# Patient Record
Sex: Male | Born: 1975 | Race: White | Hispanic: No | Marital: Single | Smoking: Never smoker
Health system: Southern US, Community
[De-identification: ages and names within clinical notes are randomized; demographics above are authoritative.]

## PROBLEM LIST (undated history)

## (undated) ENCOUNTER — Ambulatory Visit: Discharge status: Absent without leave | Payer: Self-pay

## (undated) DIAGNOSIS — J45909 Unspecified asthma, uncomplicated: Secondary | ICD-10-CM

## (undated) DIAGNOSIS — E119 Type 2 diabetes mellitus without complications: Secondary | ICD-10-CM

---

## 1998-11-12 ENCOUNTER — Emergency Department (HOSPITAL_COMMUNITY): Admission: EM | Admit: 1998-11-12 | Discharge: 1998-11-12 | Payer: Self-pay | Admitting: Emergency Medicine

## 2006-11-12 ENCOUNTER — Emergency Department: Payer: Self-pay | Admitting: Unknown Physician Specialty

## 2008-11-18 ENCOUNTER — Inpatient Hospital Stay: Payer: Self-pay | Admitting: Internal Medicine

## 2009-06-26 ENCOUNTER — Emergency Department: Payer: Self-pay | Admitting: Emergency Medicine

## 2009-06-28 ENCOUNTER — Emergency Department: Payer: Self-pay | Admitting: Emergency Medicine

## 2011-05-16 ENCOUNTER — Inpatient Hospital Stay: Payer: Self-pay | Admitting: Internal Medicine

## 2011-06-05 ENCOUNTER — Ambulatory Visit: Payer: Self-pay | Admitting: Internal Medicine

## 2011-06-30 ENCOUNTER — Ambulatory Visit: Payer: Self-pay | Admitting: Internal Medicine

## 2011-07-28 ENCOUNTER — Ambulatory Visit: Payer: Self-pay | Admitting: Internal Medicine

## 2014-09-20 NOTE — Discharge Summary (Signed)
PATIENT NAME:  Dylan Buck, [name] MR#:  [id number] DATE OF BIRTH:  [birth year]  DATE OF ADMISSION:  05/16/2011 DATE OF DISCHARGE:  05/18/2011  PRIMARY CARE PHYSICIAN: Dr. Loma Senderharles Phillips   REASON FOR ADMISSION: Abdominal pain, nausea, vomiting.   DISCHARGE DIAGNOSES:  1. Diabetic ketoacidosis.  2. Abdominal pain, nausea, vomiting secondary to diabetic ketoacidosis.  3. Poorly controlled diabetes mellitus, now insulin requiring.  4. History of asthma.  5. Leukocytosis, likely stress induced.  6. Sinus tachycardia.   DISCHARGE MEDICATIONS:  1. Lantus 28 units subcutaneous q.a.m. 2. Glulisine 7 units subcutaneously q.a.c. 3. Glulisine sliding scale insulin q.a.c. and at bedtime. See prescription for sliding scale. 4. Aspirin 81 mg daily.  5. Tylenol 325 mg 1 to 2 tablets p.o. every 4 to 6 hours p.r.n. pain.  6. Prilosec 20 mg p.o. b.i.d.   CONSULTATIONS: Endocrinology Dr. Tedd SiasSolum.   LABORATORY, DIAGNOSTIC AND RADIOLOGICAL DATA:  Portable chest x-ray 05/16/2011: No acute cardiopulmonary abnormalities are noted.   CT of the abdomen and pelvis without contrast 05/16/2011: Subtle focus of mineral deposition left kidney. No hydronephrosis. Stomach and esophagus are mildly distended with fluid, nonspecific. There is possible gallbladder sludge.    Serum glucose 535 on admission with anion gap 28, bicarbonate 96, hemoglobin A1c 14.5.   Cardiac enzymes negative on admission.   CBC normal on admission except for WBC elevated at 21.   WBC 6.7 on the day of discharge.   Blood cultures x2 from 05/16/2011: No growth to date.   Urine culture 05/16/2011: No growth to date.   Insulin level low at 1.1 and C-peptide level 0.1.   Anti-GAD antibodies were negative.   ABG on admission pH 7, pCO2 19, pO2 121, bicarbonate incalculable.   BRIEF HISTORY AND HOSPITAL COURSE: Patient is a 39 year old male with history of diabetes mellitus who presented to the Emergency Department with complaints of  abdominal pain, nausea, vomiting. Please see dictated admission history and physical for pertinent details surrounding the onset of this hospitalization. Please see below for further details.  1. Abdominal pain, nausea/vomiting secondary diabetic ketoacidosis, as evidenced by elevated serum glucose levels with anion gap metabolic acidosis. Patient underwent CT of the abdomen which revealed some distention of his esophagus and stomach, otherwise was unremarkable. Patient was admitted to Critical Care Unit and placed on an insulin drip in addition to IV fluids. With these measures patient's overall clinical condition has significantly improved and back to baseline and his abdominal pain, nausea, vomiting have resolved. Once his anion gap had closed he was taken off the insulin drip and switched over to subcutaneous insulin. He was on oral hypoglycemics at the time of admission. Endocrinology consultation was obtained and Dr. Tedd SiasSolum feels patient has insulin-dependent diabetes mellitus and was noted to have low insulin and C-peptide levels and anti-GAD antibodies were negative. For now patient will be on insulin as his diabetes appears to be very poorly controlled with hemoglobin A1c of 14.5. After being started on Lantus and glulisine scheduled as well as sliding scale his blood sugars are much better controlled.  2. Sinus tachycardia, due to acute illness, diabetic ketoacidosis, volume depletion and from vomiting and heart rate has normalized with IV fluids and management of patient's DKA.  3. Asthma. Stable and without acute exacerbation.  4. Leukocytosis felt to be stress induced. Blood and urine cultures did not reveal any growth to date. Patient was afebrile and it was felt less likely that he had infectious etiology of his diabetic ketoacidosis and his  diabetic ketoacidosis was felt to be secondary to insulin deficiency.  5. On 05/18/2011 patient was hemodynamically stable and without any abdominal pain,  nausea and vomiting and was felt to be stable for discharge home with close outpatient follow up. He will also follow up with Dr. Tedd Sias of endocrinology as an outpatient for further management of patient's diabetes mellitus.   FOLLOW UP INSTRUCTIONS:  1. Follow up with Dr. Loma Sender within 1 to 2 weeks.  2. Follow up with Dr. Tedd Sias within 1 to 2 weeks.    DISCHARGE DISPOSITION: Home.   DISCHARGE CONDITION: Improved, stable.  DISCHARGE ACTIVITY: As tolerated.   DISCHARGE DIET: ADA.   TIME SPENT ON DISCHARGE: Greater than 30 minutes.   ____________________________ Elon Alas, MD knl:cms D: 05/22/2011 22:00:23 ET T: 05/24/2011 11:39:45 ET JOB#: [id number]  cc: Elon Alas, MD, <Dictator> Marcine Matar., MD A. Wendall Mola, MD Elon Alas MD ELECTRONICALLY SIGNED 06/01/2011 16:24

## 2016-08-24 ENCOUNTER — Observation Stay
Admission: EM | Admit: 2016-08-24 | Discharge: 2016-08-25 | Disposition: A | Discharge status: Home / Self Care | Payer: Self-pay | Attending: Internal Medicine | Admitting: Internal Medicine

## 2016-08-24 ENCOUNTER — Encounter: Payer: Self-pay | Admitting: Emergency Medicine

## 2016-08-24 ENCOUNTER — Emergency Department: Payer: Self-pay

## 2016-08-24 DIAGNOSIS — J45909 Unspecified asthma, uncomplicated: Secondary | ICD-10-CM | POA: Insufficient documentation

## 2016-08-24 DIAGNOSIS — R0602 Shortness of breath: Secondary | ICD-10-CM | POA: Insufficient documentation

## 2016-08-24 DIAGNOSIS — E1010 Type 1 diabetes mellitus with ketoacidosis without coma: Secondary | ICD-10-CM | POA: Insufficient documentation

## 2016-08-24 DIAGNOSIS — D72829 Elevated white blood cell count, unspecified: Secondary | ICD-10-CM | POA: Insufficient documentation

## 2016-08-24 DIAGNOSIS — Z794 Long term (current) use of insulin: Secondary | ICD-10-CM | POA: Insufficient documentation

## 2016-08-24 DIAGNOSIS — R112 Nausea with vomiting, unspecified: Secondary | ICD-10-CM | POA: Insufficient documentation

## 2016-08-24 DIAGNOSIS — N179 Acute kidney failure, unspecified: Principal | ICD-10-CM | POA: Insufficient documentation

## 2016-08-24 DIAGNOSIS — W57XXXA Bitten or stung by nonvenomous insect and other nonvenomous arthropods, initial encounter: Secondary | ICD-10-CM | POA: Insufficient documentation

## 2016-08-24 DIAGNOSIS — E109 Type 1 diabetes mellitus without complications: Secondary | ICD-10-CM

## 2016-08-24 DIAGNOSIS — R51 Headache: Secondary | ICD-10-CM | POA: Insufficient documentation

## 2016-08-24 DIAGNOSIS — R109 Unspecified abdominal pain: Secondary | ICD-10-CM | POA: Insufficient documentation

## 2016-08-24 DIAGNOSIS — R42 Dizziness and giddiness: Secondary | ICD-10-CM | POA: Insufficient documentation

## 2016-08-24 DIAGNOSIS — E1069 Type 1 diabetes mellitus with other specified complication: Secondary | ICD-10-CM | POA: Insufficient documentation

## 2016-08-24 DIAGNOSIS — Z91040 Latex allergy status: Secondary | ICD-10-CM | POA: Insufficient documentation

## 2016-08-24 HISTORY — DX: Type 2 diabetes mellitus without complications: E11.9

## 2016-08-24 HISTORY — DX: Unspecified asthma, uncomplicated: J45.909

## 2016-08-24 LAB — COMPREHENSIVE METABOLIC PANEL
ALT: 30 U/L (ref 17–63)
AST: 25 U/L (ref 15–41)
Albumin: 5.1 g/dL — ABNORMAL HIGH (ref 3.5–5.0)
Alkaline Phosphatase: 87 U/L (ref 38–126)
Anion gap: 18 — ABNORMAL HIGH (ref 5–15)
BILIRUBIN TOTAL: 0.8 mg/dL (ref 0.3–1.2)
BUN: 32 mg/dL — AB (ref 6–20)
CALCIUM: 10.4 mg/dL — AB (ref 8.9–10.3)
CO2: 23 mmol/L (ref 22–32)
CREATININE: 1.89 mg/dL — AB (ref 0.61–1.24)
Chloride: 96 mmol/L — ABNORMAL LOW (ref 101–111)
GFR calc Af Amer: 49 mL/min — ABNORMAL LOW (ref >60)
GFR, EST NON AFRICAN AMERICAN: 42 mL/min — AB (ref >60)
Glucose, Bld: 172 mg/dL — ABNORMAL HIGH (ref 65–99)
POTASSIUM: 3.9 mmol/L (ref 3.5–5.1)
Sodium: 137 mmol/L (ref 135–145)
Total Protein: 8.4 g/dL — ABNORMAL HIGH (ref 6.5–8.1)

## 2016-08-24 LAB — TROPONIN I
Troponin I: <0.03 ng/mL (ref <0.03)
Troponin I: <0.03 ng/mL (ref <0.03)

## 2016-08-24 LAB — LACTIC ACID, PLASMA: LACTIC ACID, VENOUS: 1.4 mmol/L (ref 0.5–1.9)

## 2016-08-24 LAB — CBC
HCT: 43.4 % (ref 40.0–52.0)
Hemoglobin: 15.4 g/dL (ref 13.0–18.0)
MCH: 32.2 pg (ref 26.0–34.0)
MCHC: 35.4 g/dL (ref 32.0–36.0)
MCV: 90.8 fL (ref 80.0–100.0)
PLATELETS: 418 10*3/uL (ref 150–440)
RBC: 4.78 MIL/uL (ref 4.40–5.90)
RDW: 13.2 % (ref 11.5–14.5)
WBC: 18.4 10*3/uL — ABNORMAL HIGH (ref 3.8–10.6)

## 2016-08-24 LAB — GLUCOSE, CAPILLARY
GLUCOSE-CAPILLARY: 253 mg/dL — AB (ref 65–99)
Glucose-Capillary: 151 mg/dL — ABNORMAL HIGH (ref 65–99)

## 2016-08-24 LAB — SALICYLATE LEVEL: SALICYLATE LVL: 16.1 mg/dL (ref 2.8–30.0)

## 2016-08-24 LAB — CK: CK TOTAL: 272 U/L (ref 49–397)

## 2016-08-24 LAB — BETA-HYDROXYBUTYRIC ACID: BETA-HYDROXYBUTYRIC ACID: 2.22 mmol/L — AB (ref 0.05–0.27)

## 2016-08-24 MED ORDER — IOPAMIDOL (ISOVUE-300) INJECTION 61%
30.0000 mL | Freq: Once | INTRAVENOUS | Status: AC | PRN
  Administered 2016-08-24: 30 mL via ORAL

## 2016-08-24 MED ORDER — SODIUM CHLORIDE 0.9 % IV BOLUS (SEPSIS)
1000.0000 mL | Freq: Once | INTRAVENOUS | Status: AC
  Administered 2016-08-25: 1000 mL via INTRAVENOUS

## 2016-08-24 MED ORDER — ONDANSETRON HCL 4 MG/2ML IJ SOLN
4.0000 mg | Freq: Once | INTRAMUSCULAR | Status: AC
  Administered 2016-08-24: 4 mg via INTRAVENOUS
  Filled 2016-08-24: qty 2

## 2016-08-24 MED ORDER — IOPAMIDOL (ISOVUE-300) INJECTION 61%
75.0000 mL | Freq: Once | INTRAVENOUS | Status: AC | PRN
  Administered 2016-08-24: 75 mL via INTRAVENOUS

## 2016-08-24 MED ORDER — MORPHINE SULFATE (PF) 4 MG/ML IV SOLN
4.0000 mg | Freq: Once | INTRAVENOUS | Status: AC
  Administered 2016-08-24: 4 mg via INTRAVENOUS
  Filled 2016-08-24: qty 1

## 2016-08-24 MED ORDER — DOXYCYCLINE HYCLATE 100 MG PO CAPS: 100.0000 mg | ORAL_CAPSULE | Freq: Two times a day (BID) | ORAL | 0 refills | Status: DC

## 2016-08-24 MED ORDER — SODIUM CHLORIDE 0.9 % IV BOLUS (SEPSIS)
1000.0000 mL | Freq: Once | INTRAVENOUS | Status: AC
  Administered 2016-08-24: 1000 mL via INTRAVENOUS

## 2016-08-24 MED ORDER — INSULIN ASPART 100 UNIT/ML ~~LOC~~ SOLN
5.0000 [IU] | Freq: Once | SUBCUTANEOUS | Status: AC
  Administered 2016-08-25: 5 [IU] via SUBCUTANEOUS
  Filled 2016-08-24: qty 5

## 2016-08-24 NOTE — ED Provider Notes (Signed)
[facility] Emergency Department Provider Note   ____________________________________________   First MD Initiated Contact with Patient 08/24/16 2123     (approximate)  I have reviewed the triage vital signs and the nursing notes.   HISTORY  Chief Complaint Abdominal pain   HPI Dylan Buck is a 41 y.o. male reports she was in normal state of health, until about noon today when he been experiencing cramping abdominal discomfort, left-sided abdominal pain and then began feeling short of breath because of it hurting in his stomach whenever he went to breathe. No chest pain.  Patient reports he's had similar in the past a few years ago when he had "DKA", but he checked his blood sugar this morning and it was in the 150 range, covered normally with insulin, and has not missed any insulin and checks his blood sugar regularly and denies any recent blood sugars greater than 200.  He does report that he found a tick that was slightly engorged on his left ankle, that was found this morning but he denies any body aches, fevers chills or malaise. No headache.   Past Medical History:  Diagnosis Date  . Asthma   . Diabetes mellitus without complication [facility])     Patient Active Problem List   Diagnosis Date Noted  . Acute kidney injury (HCC) 08/25/2016  . AKI (acute kidney injury) (HCC) 08/25/2016    History reviewed. No pertinent surgical history.  Prior to Admission medications   Medication Sig Start Date End Date Taking? Authorizing Provider  insulin aspart (NOVOLOG) 100 UNIT/ML injection Inject 6 Units into the skin 3 (three) times daily before meals. Per sliding scale   Yes Historical Provider, MD  insulin glargine (LANTUS) 100 UNIT/ML injection Inject 28 Units into the skin at bedtime.   Yes Historical Provider, MD    Allergies Latex  History reviewed. No pertinent family history.  Social History Social History  Substance Use Topics  .  Smoking status: Never Smoker  . Smokeless tobacco: Never Used  . Alcohol use No  No smokeless tobacco Patient reports he used to use alcohol, but has quit for over 2 years now  Review of Systems Constitutional: No fever/chills Eyes: No visual changes. ENT: No sore throat. Cardiovascular: Denies chest pain. Respiratory: See history of present illness. No wheezing. Gastrointestinal: No diarrhea.  No constipation. Genitourinary: Negative for dysuria. Musculoskeletal: Negative for back pain. Skin: Negative for rash. Neurological: Negative for headaches, focal weakness or numbness.  10-point ROS otherwise negative.  ____________________________________________   PHYSICAL EXAM:  VITAL SIGNS: ED Triage Vitals  Enc Vitals Group     BP 08/24/16 1947 (!) 135/97     Pulse Rate 08/24/16 1947 (!) 112     Resp 08/24/16 1947 20     Temp 08/24/16 1947 97.9 F (36.6 C)     Temp Source 08/24/16 1947 Oral     SpO2 08/24/16 1947 100 %     Weight 08/24/16 1947 150 lb (68 kg)     Height 08/24/16 1947 5\' 11"  (1.803 m)     Head Circumference --      Peak Flow --      Pain Score 08/24/16 1946 0     Pain Loc --      Pain Edu? --      Excl. in GC? --     Constitutional: Alert and oriented. Well appearing and in no acute distress. Eyes: Conjunctivae are normal. PERRL. EOMI. Head: Atraumatic. Nose: No congestion/rhinnorhea.  Mouth/Throat: Mucous membranes are Slightly dry.  Oropharynx non-erythematous. Neck: No stridor.   Cardiovascular: Normal rate, regular rhythm. Grossly normal heart sounds.  Good peripheral circulation. Respiratory: Normal respiratory effort.  No retractions. Lungs CTAB. Gastrointestinal: Soft and reports mild tenderness to palpation in the left flank and left upper quadrant without rebound or guarding.. No distention. No abdominal bruits. No CVA tenderness. Musculoskeletal: No lower extremity tenderness nor edema.  No joint effusions. Neurologic:  Normal speech and  language. No gross focal neurologic deficits are appreciated. Skin:  Skin is warm, dry and intact. No rash noted. Psychiatric: Mood and affect are normal. Speech and behavior are normal.  ____________________________________________   LABS (all labs ordered are listed, but only abnormal results are displayed)  Labs Reviewed  CBC - Abnormal; Notable for the following:       Result Value   WBC 18.4 (*)    All other components within normal limits  COMPREHENSIVE METABOLIC PANEL - Abnormal; Notable for the following:    Chloride 96 (*)    Glucose, Bld 172 (*)    BUN 32 (*)    Creatinine, Ser 1.89 (*)    Calcium 10.4 (*)    Total Protein 8.4 (*)    Albumin 5.1 (*)    GFR calc non Af Amer 42 (*)    GFR calc Af Amer 49 (*)    Anion gap 18 (*)    All other components within normal limits  URINALYSIS, COMPLETE (UACMP) WITH MICROSCOPIC - Abnormal; Notable for the following:    Color, Urine YELLOW (*)    APPearance CLEAR (*)    Specific Gravity, Urine 1.031 (*)    Glucose, UA >=500 (*)    Ketones, ur 20 (*)    All other components within normal limits  BLOOD GAS, VENOUS - Abnormal; Notable for the following:    pCO2, Ven 40 (*)    All other components within normal limits  GLUCOSE, CAPILLARY - Abnormal; Notable for the following:    Glucose-Capillary 151 (*)    All other components within normal limits  BETA-HYDROXYBUTYRIC ACID - Abnormal; Notable for the following:    Beta-Hydroxybutyric Acid 2.22 (*)    All other components within normal limits  GLUCOSE, CAPILLARY - Abnormal; Notable for the following:    Glucose-Capillary 253 (*)    All other components within normal limits  BASIC METABOLIC PANEL - Abnormal; Notable for the following:    Sodium 133 (*)    Chloride 99 (*)    Glucose, Bld 277 (*)    BUN 31 (*)    Creatinine, Ser 1.46 (*)    GFR calc non Af Amer 58 (*)    All other components within normal limits  PHOSPHORUS - Abnormal; Notable for the following:     Phosphorus 5.1 (*)    All other components within normal limits  CBC - Abnormal; Notable for the following:    RBC 3.77 (*)    Hemoglobin 12.4 (*)    HCT 34.6 (*)    All other components within normal limits  COMPREHENSIVE METABOLIC PANEL - Abnormal; Notable for the following:    Sodium 133 (*)    Glucose, Bld 342 (*)    BUN 26 (*)    Calcium 8.1 (*)    Total Protein 6.0 (*)    All other components within normal limits  URINE DRUG SCREEN, QUALITATIVE ([facility] ONLY) - Abnormal; Notable for the following:    Opiate, Ur Screen POSITIVE (*)  Cannabinoid 50 Ng, Ur [place] POSITIVE (*)    All other components within normal limits  GLUCOSE, CAPILLARY - Abnormal; Notable for the following:    Glucose-Capillary 315 (*)    All other components within normal limits  GLUCOSE, CAPILLARY - Abnormal; Notable for the following:    Glucose-Capillary 213 (*)    All other components within normal limits  TROPONIN I  TROPONIN I  CK  LACTIC ACID, PLASMA  SALICYLATE LEVEL  MAGNESIUM  ACETAMINOPHEN LEVEL  SALICYLATE LEVEL  ETHANOL  B. BURGDORFI ANTIBODIES  ROCKY MTN SPOTTED FVR ABS PNL(IGG+IGM)  HIV ANTIBODY (ROUTINE TESTING)  HEMOGLOBIN A1C  CBG MONITORING, ED  CBG MONITORING, ED  CBG MONITORING, ED   ____________________________________________  EKG  Reviewed and interpreted by me at 2205 Heart rate 85 QRS 85 QTC 420 Normal sinus rhythm, probable left ventricular hypertrophy with slight repolarization abnormality, compared with previous no significant changes found  ____________________________________________  RADIOLOGY  Dg Chest 2 View  Result Date: 08/24/2016 CLINICAL DATA:  Headaches with cramping, nausea and vomiting. Dyspnea and dizziness. EXAM: CHEST  2 VIEW COMPARISON:  05/16/2011 FINDINGS: The heart size and mediastinal contours are within normal limits. Both lungs are clear. The visualized skeletal structures are unremarkable. IMPRESSION: No active cardiopulmonary disease.  Electronically Signed   By: Tollie Ethavid  Kwon M.D.   On: 08/24/2016 20:25   Ct Abdomen Pelvis W Contrast  Result Date: 08/24/2016 CLINICAL DATA:  Acute onset of body aches, abdominal cramping and nausea. Shortness of breath and dizziness. Initial encounter. EXAM: CT ABDOMEN AND PELVIS WITH CONTRAST TECHNIQUE: Multidetector CT imaging of the abdomen and pelvis was performed using the standard protocol following bolus administration of intravenous contrast. CONTRAST:  75mL ISOVUE-300 IOPAMIDOL (ISOVUE-300) INJECTION 61% COMPARISON:  CT of the abdomen and pelvis from 05/16/2011 FINDINGS: Lower chest: The visualized lung bases are grossly clear. The visualized portions of the mediastinum are unremarkable. Hepatobiliary: The liver is unremarkable in appearance. The gallbladder is unremarkable in appearance. The common bile duct remains normal in caliber. Pancreas: The pancreas is within normal limits. Spleen: The spleen is unremarkable in appearance. Adrenals/Urinary Tract: The adrenal glands are unremarkable in appearance. The kidneys are within normal limits. There is no evidence of hydronephrosis. No renal or ureteral stones are identified. No perinephric stranding is seen. Stomach/Bowel: The stomach is unremarkable in appearance. The small bowel is within normal limits. The appendix is normal in caliber, without evidence of appendicitis. The colon is unremarkable in appearance. Vascular/Lymphatic: The abdominal aorta is unremarkable in appearance. The inferior vena cava is grossly unremarkable. No retroperitoneal lymphadenopathy is seen. No pelvic sidewall lymphadenopathy is identified. Reproductive: The bladder is mildly distended and grossly unremarkable. The prostate remains normal in size. Other: No additional soft tissue abnormalities are seen. Musculoskeletal: No acute osseous abnormalities are identified. The visualized musculature is unremarkable in appearance. IMPRESSION: Unremarkable contrast-enhanced CT of  the abdomen and pelvis. Electronically Signed   By: Roanna RaiderJeffery  Chang M.D.   On: 08/24/2016 23:05    ____________________________________________   PROCEDURES  Procedure(s) performed: None  Procedures  Critical Care performed: No  ____________________________________________   INITIAL IMPRESSION / ASSESSMENT AND PLAN / ED COURSE  Pertinent labs & imaging results that were available during my care of the patient were reviewed by me and considered in my medical decision making (see chart for details).  Patient has for evaluation of nausea vomiting abdominal pain, concerns for possible DKA based on his previous experiences, and also a recent tick bite.  Overall the  patient appears relatively well, nontoxic, no laboratory evaluation is somewhat interesting in that he has an elevated anion gap with a normal glucose, and addition an elevated beta hydroxybutyrate, but a normal pH on venous gas. Question if he may have been in more still have a slight ongoing DKA picture, he adamantly denies any alcohol use making alcoholic ketoacidosis unlikely.  CK normal, 2 troponins normal, normal lactic acid, and no elevated salicylate level. Not easily able to account for his elevated anion gap other than ketones being present.  CT scan negative for acute, chest x-ray clear. No cardiac symptoms. Patient reports he is compliant with his insulin therapy, is able describe a sliding scale to me and also his regular blood sugar checks.  ----------------------------------------- 12:01 AM on 08/25/2016 -----------------------------------------  Patient reports he feels much better. He is resting comfortably. He is in no distress. Vital signs are normal, discussed with Dr. Sim Boast and recommend rechecking BMP, if improved and Has normalized advises discharge to home.  Care discussed with patient, we will recheck his BMP, if normalized likely discharge to home but continue his insulin therapy, close  follow-up with his primary, and placed on doxycycline given associated tick bite. Ongoing care signed Dr. Dolores Frame at midnight     ____________________________________________   FINAL CLINICAL IMPRESSION(S) / ED DIAGNOSES  Final diagnoses:  Type 1 diabetes mellitus without complication (HCC)  Tick bite, initial encounter      NEW MEDICATIONS STARTED DURING THIS VISIT:  Discharge Medication List as of 08/25/2016 12:26 PM    START taking these medications   Details  doxycycline (VIBRAMYCIN) 100 MG capsule Take 1 capsule (100 mg total) by mouth 2 (two) times daily., Starting Thu 08/24/2016, Print         Note:  This document was prepared using Dragon voice recognition software and may include unintentional dictation errors.     Sharyn Creamer, MD 08/25/16 [phone]

## 2016-08-24 NOTE — ED Triage Notes (Signed)
Pt has hx of diabetes states has had body aches today with abd cramping and nausea. Also co shob and dizziness, no shob noted at this time. Pt has hx of DKA states feels the same, fsbs 151 in triage.

## 2016-08-24 NOTE — ED Notes (Signed)
Patient made aware of need of urine sample. States he is unable to void at this time. 

## 2016-08-25 DIAGNOSIS — N179 Acute kidney failure, unspecified: Secondary | ICD-10-CM | POA: Diagnosis present

## 2016-08-25 LAB — URINALYSIS, COMPLETE (UACMP) WITH MICROSCOPIC
BILIRUBIN URINE: NEGATIVE
Bacteria, UA: NONE SEEN
HGB URINE DIPSTICK: NEGATIVE
KETONES UR: 20 mg/dL — AB
LEUKOCYTES UA: NEGATIVE
NITRITE: NEGATIVE
PH: 5 (ref 5.0–8.0)
Protein, ur: NEGATIVE mg/dL
RBC / HPF: NONE SEEN RBC/hpf (ref 0–5)
SPECIFIC GRAVITY, URINE: 1.031 — AB (ref 1.005–1.030)
Squamous Epithelial / LPF: NONE SEEN

## 2016-08-25 LAB — COMPREHENSIVE METABOLIC PANEL
ALBUMIN: 3.6 g/dL (ref 3.5–5.0)
ALT: 21 U/L (ref 17–63)
ANION GAP: 9 (ref 5–15)
AST: 18 U/L (ref 15–41)
Alkaline Phosphatase: 65 U/L (ref 38–126)
BUN: 26 mg/dL — ABNORMAL HIGH (ref 6–20)
CO2: 23 mmol/L (ref 22–32)
Calcium: 8.1 mg/dL — ABNORMAL LOW (ref 8.9–10.3)
Chloride: 101 mmol/L (ref 101–111)
Creatinine, Ser: 0.95 mg/dL (ref 0.61–1.24)
GFR calc Af Amer: >60 mL/min (ref >60)
GFR calc non Af Amer: >60 mL/min (ref >60)
GLUCOSE: 342 mg/dL — AB (ref 65–99)
POTASSIUM: 3.9 mmol/L (ref 3.5–5.1)
Sodium: 133 mmol/L — ABNORMAL LOW (ref 135–145)
Total Bilirubin: 0.9 mg/dL (ref 0.3–1.2)
Total Protein: 6 g/dL — ABNORMAL LOW (ref 6.5–8.1)

## 2016-08-25 LAB — GLUCOSE, CAPILLARY
GLUCOSE-CAPILLARY: 315 mg/dL — AB (ref 65–99)
GLUCOSE-CAPILLARY: 213 mg/dL — AB (ref 65–99)

## 2016-08-25 LAB — URINE DRUG SCREEN, QUALITATIVE (ARMC ONLY)
AMPHETAMINES, UR SCREEN: NOT DETECTED
Barbiturates, Ur Screen: NOT DETECTED
Benzodiazepine, Ur Scrn: NOT DETECTED
Cannabinoid 50 Ng, Ur ~~LOC~~: POSITIVE — AB
Cocaine Metabolite,Ur ~~LOC~~: NOT DETECTED
MDMA (ECSTASY) UR SCREEN: NOT DETECTED
Methadone Scn, Ur: NOT DETECTED
OPIATE, UR SCREEN: POSITIVE — AB
PHENCYCLIDINE (PCP) UR S: NOT DETECTED
Tricyclic, Ur Screen: NOT DETECTED

## 2016-08-25 LAB — BASIC METABOLIC PANEL
Anion gap: 12 (ref 5–15)
BUN: 31 mg/dL — AB (ref 6–20)
CALCIUM: 9.3 mg/dL (ref 8.9–10.3)
CHLORIDE: 99 mmol/L — AB (ref 101–111)
CO2: 22 mmol/L (ref 22–32)
CREATININE: 1.46 mg/dL — AB (ref 0.61–1.24)
GFR calc non Af Amer: 58 mL/min — ABNORMAL LOW (ref >60)
GLUCOSE: 277 mg/dL — AB (ref 65–99)
Potassium: 4.6 mmol/L (ref 3.5–5.1)
Sodium: 133 mmol/L — ABNORMAL LOW (ref 135–145)

## 2016-08-25 LAB — ETHANOL: Alcohol, Ethyl (B): <5 mg/dL (ref <5)

## 2016-08-25 LAB — CBC
HCT: 34.6 % — ABNORMAL LOW (ref 40.0–52.0)
Hemoglobin: 12.4 g/dL — ABNORMAL LOW (ref 13.0–18.0)
MCH: 32.9 pg (ref 26.0–34.0)
MCHC: 35.9 g/dL (ref 32.0–36.0)
MCV: 91.7 fL (ref 80.0–100.0)
Platelets: 304 10*3/uL (ref 150–440)
RBC: 3.77 MIL/uL — ABNORMAL LOW (ref 4.40–5.90)
RDW: 13.6 % (ref 11.5–14.5)
WBC: 10.5 10*3/uL (ref 3.8–10.6)

## 2016-08-25 LAB — MAGNESIUM: MAGNESIUM: 1.7 mg/dL (ref 1.7–2.4)

## 2016-08-25 LAB — ACETAMINOPHEN LEVEL: ACETAMINOPHEN (TYLENOL), SERUM: 11 ug/mL (ref 10–30)

## 2016-08-25 LAB — SALICYLATE LEVEL: Salicylate Lvl: 8.4 mg/dL (ref 2.8–30.0)

## 2016-08-25 LAB — PHOSPHORUS: PHOSPHORUS: 5.1 mg/dL — AB (ref 2.5–4.6)

## 2016-08-25 MED ORDER — ACETAMINOPHEN 650 MG RE SUPP: 650.0000 mg | Freq: Four times a day (QID) | RECTAL | Status: DC | PRN

## 2016-08-25 MED ORDER — ONDANSETRON HCL 4 MG PO TABS: 4.0000 mg | ORAL_TABLET | Freq: Four times a day (QID) | ORAL | Status: DC | PRN

## 2016-08-25 MED ORDER — INSULIN ASPART 100 UNIT/ML ~~LOC~~ SOLN: 0.0000 [IU] | Freq: Every day | SUBCUTANEOUS | Status: DC

## 2016-08-25 MED ORDER — OXYCODONE HCL 5 MG PO TABS
5.0000 mg | ORAL_TABLET | ORAL | Status: DC | PRN
  Administered 2016-08-25: 08:00:00 5 mg via ORAL
  Filled 2016-08-25: qty 1

## 2016-08-25 MED ORDER — IPRATROPIUM BROMIDE 0.02 % IN SOLN: 0.5000 mg | Freq: Four times a day (QID) | RESPIRATORY_TRACT | Status: DC | PRN

## 2016-08-25 MED ORDER — INSULIN ASPART 100 UNIT/ML ~~LOC~~ SOLN
0.0000 [IU] | Freq: Three times a day (TID) | SUBCUTANEOUS | Status: DC
  Administered 2016-08-25: 12:00:00 3 [IU] via SUBCUTANEOUS
  Filled 2016-08-25: qty 3

## 2016-08-25 MED ORDER — METFORMIN HCL 500 MG PO TABS: 500.0000 mg | ORAL_TABLET | Freq: Two times a day (BID) | ORAL | Status: DC

## 2016-08-25 MED ORDER — INSULIN GLARGINE 100 UNIT/ML ~~LOC~~ SOLN
10.0000 [IU] | Freq: Every day | SUBCUTANEOUS | Status: DC
  Filled 2016-08-25: qty 0.1

## 2016-08-25 MED ORDER — DOXYCYCLINE HYCLATE 100 MG PO CAPS: 100.0000 mg | ORAL_CAPSULE | Freq: Two times a day (BID) | ORAL | 0 refills | Status: DC

## 2016-08-25 MED ORDER — ACETAMINOPHEN 325 MG PO TABS
650.0000 mg | ORAL_TABLET | Freq: Four times a day (QID) | ORAL | Status: DC | PRN
  Administered 2016-08-25: 04:00:00 650 mg via ORAL
  Filled 2016-08-25: qty 2

## 2016-08-25 MED ORDER — SENNOSIDES-DOCUSATE SODIUM 8.6-50 MG PO TABS: 1.0000 | ORAL_TABLET | Freq: Every evening | ORAL | Status: DC | PRN

## 2016-08-25 MED ORDER — ZOLPIDEM TARTRATE 5 MG PO TABS: 5.0000 mg | ORAL_TABLET | Freq: Every evening | ORAL | Status: DC | PRN

## 2016-08-25 MED ORDER — ALBUTEROL SULFATE (2.5 MG/3ML) 0.083% IN NEBU: 2.5000 mg | INHALATION_SOLUTION | Freq: Four times a day (QID) | RESPIRATORY_TRACT | Status: DC | PRN

## 2016-08-25 MED ORDER — MAGNESIUM CITRATE PO SOLN
1.0000 | Freq: Once | ORAL | Status: DC | PRN
  Filled 2016-08-25: qty 296

## 2016-08-25 MED ORDER — BISACODYL 5 MG PO TBEC: 5.0000 mg | DELAYED_RELEASE_TABLET | Freq: Every day | ORAL | Status: DC | PRN

## 2016-08-25 MED ORDER — INSULIN ASPART 100 UNIT/ML ~~LOC~~ SOLN
5.0000 [IU] | Freq: Three times a day (TID) | SUBCUTANEOUS | Status: DC
  Administered 2016-08-25: 12:00:00 5 [IU] via SUBCUTANEOUS
  Filled 2016-08-25: qty 5

## 2016-08-25 MED ORDER — INSULIN ASPART 100 UNIT/ML ~~LOC~~ SOLN
0.0000 [IU] | Freq: Three times a day (TID) | SUBCUTANEOUS | Status: DC
  Administered 2016-08-25: 08:00:00 11 [IU] via SUBCUTANEOUS
  Filled 2016-08-25: qty 11

## 2016-08-25 MED ORDER — INSULIN GLARGINE 100 UNIT/ML ~~LOC~~ SOLN
28.0000 [IU] | Freq: Every day | SUBCUTANEOUS | Status: DC
  Administered 2016-08-25: 28 [IU] via SUBCUTANEOUS
  Filled 2016-08-25: qty 0.28

## 2016-08-25 MED ORDER — INSULIN ASPART 100 UNIT/ML ~~LOC~~ SOLN: 3.0000 [IU] | Freq: Three times a day (TID) | SUBCUTANEOUS | Status: DC

## 2016-08-25 MED ORDER — ONDANSETRON HCL 4 MG/2ML IJ SOLN: 4.0000 mg | Freq: Four times a day (QID) | INTRAMUSCULAR | Status: DC | PRN

## 2016-08-25 MED ORDER — SODIUM CHLORIDE 0.9 % IV SOLN
INTRAVENOUS | Status: DC
Start: 2016-08-25 — End: 2016-08-25
  Administered 2016-08-25: 04:00:00 via INTRAVENOUS

## 2016-08-25 MED ORDER — DOXYCYCLINE HYCLATE 100 MG PO TABS
200.0000 mg | ORAL_TABLET | Freq: Once | ORAL | Status: AC
  Administered 2016-08-25: 200 mg via ORAL
  Filled 2016-08-25: qty 2

## 2016-08-25 NOTE — Discharge Instructions (Signed)
You were seen in the emergency room for abdominal pain and management of your diabetes.  Please return to the emergency room right away if you are to develop a fever, severe nausea, your pain becomes severe or worsens, you are unable to keep food down, begin vomiting any dark or bloody fluid, you develop any dark or bloody stools, feel dehydrated, or other new concerns or symptoms arise.  Heart healthy and ADA diet.

## 2016-08-25 NOTE — H&P (Signed)
History and Physical   [facility] - [place] @ [facility] Admission History and Physical [employer], D.O.    Patient Name: Dylan Buck MR#: [id number] Date of Birth: 02/13/1976 Date of Admission: 08/24/2016  Referring MD/NP/PA: Dr. Fanny Bien Primary Care Physician: No PCP Per Patient Patient coming from: Home Outpatient Specialists: None   Chief Complaint:  Chief Complaint  Patient presents with  . Shortness of Breath    HPI: Dylan Buck is a 41 y.o. male with a known history of DM presents to the emergency department for evaluation of shortness of breath.  Patient was in a usual state of health until today when he Developed diffuse abdominal cramping which led to shortness of breath. Patient states that he thought he might be in DKA as he had similar symptoms in the past. However when he took his blood sugar this morning was 05/29/1950. He denies any infectious type symptoms such as fevers, chills, nausea or vomiting, dysuria, cough. The only other possible inciting event was a tick bite that was engorged and his left ankle that he found this morning.  Of note patient states that he has not seen a doctor since his last hospitalization here in 2013. He has someone prescribing him medication including NovoLog and Lantus has not had blood work in many years. He states that he takes his medications. He also reports that he takes 6-10 ibuprofen per day for arthritis pains and neuropathy.  Patient denies fevers/chills, weakness, dizziness, chest pain, shortness of breath, N/V/C/D, abdominal pain, dysuria/frequency, changes in mental status.    Otherwise there has been no change in status. Patient has been taking medication as prescribed and there has been no recent change in medication or diet.  No recent antibiotics.  There has been no recent illness, hospitalizations, travel or sick contacts.    EMS/ED Course: Patient received insulin, morphine, Zofran and normal saline.  Review  of Systems:  CONSTITUTIONAL: No fever/chills, fatigue, weakness, weight gain/loss, headache. EYES: No blurry or double vision. ENT: No tinnitus, postnasal drip, redness or soreness of the oropharynx. RESPIRATORY: No cough, heeze.  No hemoptysis. Positive dyspnea CARDIOVASCULAR: No chest pain, palpitations, syncope, orthopnea. No lower extremity edema.  GASTROINTESTINAL: No nausea, vomiting,  diarrhea, constipation.  No hematemesis, melena or hematochezia. Positive abdominal pain GENITOURINARY: No dysuria, frequency, hematuria. ENDOCRINE: No polyuria or nocturia. No heat or cold intolerance. HEMATOLOGY: No anemia, bruising, bleeding. INTEGUMENTARY: No rashes, ulcers, lesions. MUSCULOSKELETAL: No  gout, dyspnea. Positive arthritis pains. NEUROLOGIC: No numbness, tingling, ataxia, seizure-type activity, weakness. PSYCHIATRIC: No anxiety, depression, insomnia.   Past Medical History:  Diagnosis Date  . Asthma   . Diabetes mellitus without complication (HCC)     No past surgical history on file.   has no tobacco, alcohol, and drug history on file. Patient states that he quit drinking 3 years ago  Allergies  Allergen Reactions  . Latex Swelling    No family history on file.  Prior to Admission medications   Medication Sig Start Date End Date Taking? Authorizing Provider  doxycycline (VIBRAMYCIN) 100 MG capsule Take 1 capsule (100 mg total) by mouth 2 (two) times daily. 08/24/16   Sharyn Creamer, MD    Physical Exam: Vitals:   08/24/16 1947 08/24/16 2138 08/24/16 2200 08/25/16 0012  BP: (!) 135/97 127/82 128/80 129/83  Pulse: (!) 112 87 88 88  Resp: Temp: 97.9 F (36.6 C)     TempSrc: Oral     SpO2: 100% 98% 98%  100%  Weight: 68 kg (150 lb)     Height:  (1.803 m)       GENERAL: 41 y.o.-year-old White male patient, well-developed, well-nourished lying in the bed in no acute distress.  Pleasant and cooperative.   HEENT: Head atraumatic, normocephalic. Pupils  equal, round, reactive to light and accommodation. No scleral icterus. Extraocular muscles intact. Nares are patent. Oropharynx is clear. Mucus membranes dry. NECK: Supple, full range of motion. No JVD, no bruit heard. No thyroid enlargement, no tenderness, no cervical lymphadenopathy. CHEST: Normal breath sounds bilaterally. No wheezing, rales, rhonchi or crackles. No use of accessory muscles of respiration.  No reproducible chest wall tenderness.  CARDIOVASCULAR: S1, S2 normal. No murmurs, rubs, or gallops. Cap refill <2 seconds. Pulses intact distally.  ABDOMEN: Soft, nondistended, nontender. No rebound, guarding, rigidity. Normoactive bowel sounds present in all four quadrants. No organomegaly or mass. EXTREMITIES: No pedal edema, cyanosis, or clubbing. No calf tenderness or Homan's sign.  NEUROLOGIC: The patient is alert and oriented x 3. Cranial nerves II through XII are grossly intact with no focal sensorimotor deficit. Muscle strength 5/5 in all extremities. Sensation intact. Gait not checked. PSYCHIATRIC:  Normal affect, mood, thought content. SKIN: Warm, dry, and intact without obvious rash, lesion, or ulcer.    Labs on Admission:  CBC:  Recent Labs Lab 08/24/16 1953  WBC 18.4*  HGB 15.4  HCT 43.4  MCV 90.8  PLT 418   Basic Metabolic Panel:  Recent Labs Lab 08/24/16 1953 08/25/16 0000  NA 137 133*  K 3.9 4.6  CL 96* 99*  CO2 23 22  GLUCOSE 172* 277*  BUN 32* 31*  CREATININE 1.89* 1.46*  CALCIUM 10.4* 9.3   GFR: Estimated Creatinine Clearance: 63.4 mL/min (A) (by C-G formula based on SCr of 1.46 mg/dL (H)). Liver Function Tests:  Recent Labs Lab 08/24/16 1953  AST 25  ALT 30  ALKPHOS 87  BILITOT 0.8  PROT 8.4*  ALBUMIN 5.1*   No results for input(s): LIPASE, AMYLASE in the last 168 hours. No results for input(s): AMMONIA in the last 168 hours. Coagulation Profile: No results for input(s): INR, PROTIME in the last 168 hours. Cardiac Enzymes:  Recent  Labs Lab 08/24/16 1953 08/24/16 2201  CKTOTAL  --  272  TROPONINI <0.03 <0.03   BNP (last 3 results) No results for input(s): PROBNP in the last 8760 hours. HbA1C: No results for input(s): HGBA1C in the last 72 hours. CBG:  Recent Labs Lab 08/24/16 1950 08/24/16 2217  GLUCAP 151* 253*   Lipid Profile: No results for input(s): CHOL, HDL, LDLCALC, TRIG, CHOLHDL, LDLDIRECT in the last 72 hours. Thyroid Function Tests: No results for input(s): TSH, T4TOTAL, FREET4, T3FREE, THYROIDAB in the last 72 hours. Anemia Panel: No results for input(s): VITAMINB12, FOLATE, FERRITIN, TIBC, IRON, RETICCTPCT in the last 72 hours. Urine analysis: No results found for: COLORURINE, APPEARANCEUR, LABSPEC, PHURINE, GLUCOSEU, HGBUR, BILIRUBINUR, KETONESUR, PROTEINUR, UROBILINOGEN, NITRITE, LEUKOCYTESUR Sepsis Labs: (procalcitonin:4,lacticidven:4) )No results found for this or any previous visit (from the past 240 hour(s)).   Radiological Exams on Admission: Dg Chest 2 View  Result Date: 08/24/2016 CLINICAL DATA:  Headaches with cramping, nausea and vomiting. Dyspnea and dizziness. EXAM: CHEST  2 VIEW COMPARISON:  05/16/2011 FINDINGS: The heart size and mediastinal contours are within normal limits. Both lungs are clear. The visualized skeletal structures are unremarkable. IMPRESSION: No active cardiopulmonary disease. Electronically Signed   By: Tollie Eth M.D.   On: 08/24/2016 20:25   Ct  Abdomen Pelvis W Contrast  Result Date: 08/24/2016 CLINICAL DATA:  Acute onset of body aches, abdominal cramping and nausea. Shortness of breath and dizziness. Initial encounter. EXAM: CT ABDOMEN AND PELVIS WITH CONTRAST TECHNIQUE: Multidetector CT imaging of the abdomen and pelvis was performed using the standard protocol following bolus administration of intravenous contrast. CONTRAST:  75mL ISOVUE-300 IOPAMIDOL (ISOVUE-300) INJECTION 61% COMPARISON:  CT of the abdomen and pelvis from 05/16/2011 FINDINGS:  Lower chest: The visualized lung bases are grossly clear. The visualized portions of the mediastinum are unremarkable. Hepatobiliary: The liver is unremarkable in appearance. The gallbladder is unremarkable in appearance. The common bile duct remains normal in caliber. Pancreas: The pancreas is within normal limits. Spleen: The spleen is unremarkable in appearance. Adrenals/Urinary Tract: The adrenal glands are unremarkable in appearance. The kidneys are within normal limits. There is no evidence of hydronephrosis. No renal or ureteral stones are identified. No perinephric stranding is seen. Stomach/Bowel: The stomach is unremarkable in appearance. The small bowel is within normal limits. The appendix is normal in caliber, without evidence of appendicitis. The colon is unremarkable in appearance. Vascular/Lymphatic: The abdominal aorta is unremarkable in appearance. The inferior vena cava is grossly unremarkable. No retroperitoneal lymphadenopathy is seen. No pelvic sidewall lymphadenopathy is identified. Reproductive: The bladder is mildly distended and grossly unremarkable. The prostate remains normal in size. Other: No additional soft tissue abnormalities are seen. Musculoskeletal: No acute osseous abnormalities are identified. The visualized musculature is unremarkable in appearance. IMPRESSION: Unremarkable contrast-enhanced CT of the abdomen and pelvis. Electronically Signed   By: Roanna Raider M.D.   On: 08/24/2016 23:05    EKG: Normal sinus rhythm at 85 bpm with normal axis, LVH and nonspecific ST-T wave changes.   Assessment/Plan  This is a 41 y.o. male with a history of asthma, diabetes now being admitted with:  #. Acute kidney injury and anion gap acidosis likely secondary to NSAID use. -Admit to inpatient -IV fluid hydration -Avoid NSAIDs -Consider nephrology consultation if not improving. -Follow-up urinalysis Check salicylate level, acetaminophen level, urine drug screen, alcohol  level  #. Leukocytosis without any evidence of acute infection. Recent history of tick bite -One time 200 mg dose of doxycycline -Follow up urinalysis and blood cultures  #. H/o Diabetes - Accuchecks achs with RISS coverage - Heart healthy, carb controlled diet -Continue Lantus -Check hemoglobin A1c  Patient will need a Child psychotherapist for emergency insurance coverage. We will also need to establish that he has appropriate outpatient follow-up care upon discharge from the hospital as he has been receiving his medications without appropriate labs and follow-up for many years.  Admission status: Inpatient IV Fluids: Normal saline Diet/Nutrition: Heart healthy, carb controlled Consults called: None, consider nephrology  DVT Px: SCDs and early ambulation. Code Status: Full Code  Disposition Plan: To home in 1-2 days  All the records are reviewed and case discussed with ED provider. Management plans discussed with the patient and/or family who express understanding and agree with plan of care.  Lyndzie Zentz D.O. on 08/25/2016 at 1:02 AM Between 7am to 6pm - Pager - [phone] After 6pm go to [email or web address] - Biomedical engineer [facility] Office [phone] CC: Primary care physician; No PCP Per Patient   08/25/2016, 1:02 AM

## 2016-08-25 NOTE — Discharge Summary (Signed)
[facility] - [place] [facility]   PATIENT NAME: Dylan Buck    MR#:  [id number]  DATE OF BIRTH:  03/08/1976  DATE OF ADMISSION:  08/24/2016   ADMITTING PHYSICIAN: Tonye Royalty, DO  DATE OF DISCHARGE: 08/25/2016 12:53 PM  PRIMARY CARE PHYSICIAN: No PCP Per Patient   ADMISSION DIAGNOSIS:  Type 1 diabetes mellitus without complication (HCC) [E10.9] Tick bite, initial encounter [N56.XXXA] DISCHARGE DIAGNOSIS:  Active Problems:   Acute kidney injury (HCC)   AKI (acute kidney injury) (HCC)  SECONDARY DIAGNOSIS:   Past Medical History:  Diagnosis Date  . Asthma   . Diabetes mellitus without complication [facility])    HOSPITAL COURSE:   This is a 41 y.o. male with a history of asthma, diabetes now being admitted with:  #. Acute kidney injury and anion gap acidosis likely secondary to NSAID use. Improved withIV fluid hydration -Avoid NSAIDs  #. Leukocytosis without any evidence of acute infection. Recent history of tick bite -One time 200 mg dose of doxycycline given. Leukocytosis improved.  #. Diabetes type 1. Continue Lantus 28 units daily and NovoLog 3 times a day before meals. Follow-up hemoglobin A1c as outpatient.  DISCHARGE CONDITIONS:  Stable, discharged to home today. CONSULTS OBTAINED:   DRUG ALLERGIES:   Allergies  Allergen Reactions  . Latex Swelling   DISCHARGE MEDICATIONS:   Allergies as of 08/25/2016      Reactions   Latex Swelling      Medication List    TAKE these medications   insulin aspart 100 UNIT/ML injection Commonly known as:  novoLOG Inject 6 Units into the skin 3 (three) times daily before meals. Per sliding scale   insulin glargine 100 UNIT/ML injection Commonly known as:  LANTUS Inject 28 Units into the skin at bedtime.        DISCHARGE INSTRUCTIONS:  See AVS.  If you experience worsening of your admission symptoms, develop shortness of breath, life threatening emergency, suicidal or homicidal  thoughts you must seek medical attention immediately by calling 911 or calling your MD immediately  if symptoms less severe.  You Must read complete instructions/literature along with all the possible adverse reactions/side effects for all the Medicines you take and that have been prescribed to you. Take any new Medicines after you have completely understood and accpet all the possible adverse reactions/side effects.   Please note  You were cared for by a hospitalist during your hospital stay. If you have any questions about your discharge medications or the care you received while you were in the hospital after you are discharged, you can call the unit and asked to speak with the hospitalist on call if the hospitalist that took care of you is not available. Once you are discharged, your primary care physician will handle any further medical issues. Please note that NO REFILLS for any discharge medications will be authorized once you are discharged, as it is imperative that you return to your primary care physician (or establish a relationship with a primary care physician if you do not have one) for your aftercare needs so that they can reassess your need for medications and monitor your lab values.    On the day of Discharge:  VITAL SIGNS:  Blood pressure (!) 157/91, pulse 84, temperature 97.8 F (36.6 C), temperature source Oral, resp. rate 16, height  (1.803 m), weight 150 lb 2.1 oz (68.1 kg), SpO2 100 %. PHYSICAL EXAMINATION:  GENERAL:  41 y.o.-year-old patient lying in the bed  with no acute distress.  EYES: Pupils equal, round, reactive to light and accommodation. No scleral icterus. Extraocular muscles intact.  HEENT: Head atraumatic, normocephalic. Oropharynx and nasopharynx clear.  NECK:  Supple, no jugular venous distention. No thyroid enlargement, no tenderness.  LUNGS: Normal breath sounds bilaterally, no wheezing, rales,rhonchi or crepitation. No use of accessory muscles of  respiration.  CARDIOVASCULAR: S1, S2 normal. No murmurs, rubs, or gallops.  ABDOMEN: Soft, non-tender, non-distended. Bowel sounds present. No organomegaly or mass.  EXTREMITIES: No pedal edema, cyanosis, or clubbing.  NEUROLOGIC: Cranial nerves II through XII are intact. Muscle strength 5/5 in all extremities. Sensation intact. Gait not checked.  PSYCHIATRIC: The patient is alert and oriented x 3.  SKIN: No obvious rash, lesion, or ulcer.  DATA REVIEW:   CBC  Recent Labs Lab 08/25/16 0439  WBC 10.5  HGB 12.4*  HCT 34.6*  PLT 304    Chemistries   Recent Labs Lab 08/25/16 0439  NA 133*  K 3.9  CL 101  CO2 23  GLUCOSE 342*  BUN 26*  CREATININE 0.95  CALCIUM 8.1*  MG 1.7  AST 18  ALT 21  ALKPHOS 65  BILITOT 0.9     Microbiology Results  No results found for this or any previous visit.  RADIOLOGY:  Dg Chest 2 View  Result Date: 08/24/2016 CLINICAL DATA:  Headaches with cramping, nausea and vomiting. Dyspnea and dizziness. EXAM: CHEST  2 VIEW COMPARISON:  05/16/2011 FINDINGS: The heart size and mediastinal contours are within normal limits. Both lungs are clear. The visualized skeletal structures are unremarkable. IMPRESSION: No active cardiopulmonary disease. Electronically Signed   By: Tollie Eth M.D.   On: 08/24/2016 20:25   Ct Abdomen Pelvis W Contrast  Result Date: 08/24/2016 CLINICAL DATA:  Acute onset of body aches, abdominal cramping and nausea. Shortness of breath and dizziness. Initial encounter. EXAM: CT ABDOMEN AND PELVIS WITH CONTRAST TECHNIQUE: Multidetector CT imaging of the abdomen and pelvis was performed using the standard protocol following bolus administration of intravenous contrast. CONTRAST:  75mL ISOVUE-300 IOPAMIDOL (ISOVUE-300) INJECTION 61% COMPARISON:  CT of the abdomen and pelvis from 05/16/2011 FINDINGS: Lower chest: The visualized lung bases are grossly clear. The visualized portions of the mediastinum are unremarkable. Hepatobiliary: The  liver is unremarkable in appearance. The gallbladder is unremarkable in appearance. The common bile duct remains normal in caliber. Pancreas: The pancreas is within normal limits. Spleen: The spleen is unremarkable in appearance. Adrenals/Urinary Tract: The adrenal glands are unremarkable in appearance. The kidneys are within normal limits. There is no evidence of hydronephrosis. No renal or ureteral stones are identified. No perinephric stranding is seen. Stomach/Bowel: The stomach is unremarkable in appearance. The small bowel is within normal limits. The appendix is normal in caliber, without evidence of appendicitis. The colon is unremarkable in appearance. Vascular/Lymphatic: The abdominal aorta is unremarkable in appearance. The inferior vena cava is grossly unremarkable. No retroperitoneal lymphadenopathy is seen. No pelvic sidewall lymphadenopathy is identified. Reproductive: The bladder is mildly distended and grossly unremarkable. The prostate remains normal in size. Other: No additional soft tissue abnormalities are seen. Musculoskeletal: No acute osseous abnormalities are identified. The visualized musculature is unremarkable in appearance. IMPRESSION: Unremarkable contrast-enhanced CT of the abdomen and pelvis. Electronically Signed   By: Roanna Raider M.D.   On: 08/24/2016 23:05     Management plans discussed with the patient, family and they are in agreement.  CODE STATUS: Full Code   TOTAL TIME TAKING CARE OF THIS PATIENT: 57  minutes.    Shaune Pollack M.D on 08/25/2016 at 2:53 PM  Between 7am to 6pm - Pager - [phone]  After 6pm go to [email or web address] - Scientist, research (life sciences) [facility]  Office  [phone]  CC: Primary care physician; No PCP Per Patient   Note: This dictation was prepared with Dragon dictation along with smaller phrase technology. Any transcriptional errors that result from this process are unintentional.

## 2016-08-25 NOTE — Clinical Social Work Note (Signed)
CSW consulted for "medications."  CSW updated RNCM, who is aware. CSW is signing off as no further needs identified.   Dede Query, MSW, LCSW  Clinical Social Worker  [phone]

## 2016-08-25 NOTE — Care Management Note (Signed)
Case Management Note  Patient Details  Name: Dylan Buck MRN: [id number] Date of Birth: 05-May-1976  Subjective/Objective:   Met with patient to discuss discharge planning. He states he doe not have a PCP. He uses a mail order pharmacy that cost him $75 /mon to get his presriptions. Application given for medication management and [facility]. Referral sent to both agencies. Patient independent other wise and has no further needs.                 Action/Plan: [facility] and [facility] applications given  Expected Discharge Date:  08/25/16               Expected Discharge Plan:  Home w Hospice Care  In-House Referral:     Discharge planning Services  CM Consult, Homebound not met per provider, [facility], Medication Assistance  Post Acute Care Choice:    Choice offered to:     DME Arranged:    DME Agency:     HH Arranged:    HH Agency:     Status of Service:  Completed, signed off  If discussed at [employer], dates discussed:    Additional Comments:  Jolly Mango, RN 08/25/2016, 9:45 AM

## 2016-08-25 NOTE — Progress Notes (Addendum)
Inpatient Diabetes Program Recommendations  AACE/ADA: New Consensus Statement on Inpatient Glycemic Control (2015)  Target Ranges:  Prepandial:   less than 140 mg/dL      Peak postprandial:   less than 180 mg/dL (1-2 hours)      Critically ill patients:  140 - 180 mg/dL   Lab Results  Component Value Date   GLUCAP 315 (H) 08/25/2016    Review of Glycemic Control  Results for [name], [name] (MRN [id number]) as of 08/25/2016 08:08  Ref. Range 08/24/2016 19:50 08/24/2016 22:17 08/25/2016 07:27  Glucose-Capillary Latest Ref Range: 65 - 99 mg/dL 564 (H) 332 (H) 951 (H)    Diabetes history: Type 1 Outpatient Diabetes medications: Lantus 28 units qhs, Novolog 6 units tid Current orders for Inpatient glycemic control: Lantus 10 units qam, Novolog moderate correction 0-15 units tid, Novolog 0-5 units qhs  Inpatient Diabetes Program Recommendations:  Please increase Lantus to 28 units qday, add Novolog 5 units tid and decrease Novolog correction to sensitive 0-9 units tid .  Continue Novolog 0-5 units qhs as ordered.   Spoke to patient at the bedside- indicates he has had Type 1 diabetes for over 10 years- gets medications for free through a mail order company called [facility].  Will need prescriptions for medications at discharge- only has about 5 vials of insulin left.  Text page to Dr. Imogene Burn.  Dylan Racer, RN, BA, MHA, CDE Diabetes Coordinator Inpatient Diabetes Program  [phone] (Team Pager) [phone] [facility] Office) 08/25/2016 8:11 AM

## 2016-08-26 LAB — HEMOGLOBIN A1C
Hgb A1c MFr Bld: 11.1 % — ABNORMAL HIGH (ref 4.8–5.6)
Mean Plasma Glucose: 272 mg/dL

## 2016-08-26 LAB — HIV ANTIBODY (ROUTINE TESTING W REFLEX): HIV Screen 4th Generation wRfx: NONREACTIVE

## 2016-08-28 LAB — B. BURGDORFI ANTIBODIES: B burgdorferi Ab IgG+IgM: <0.91 {ISR} (ref 0.00–0.90)

## 2016-08-29 LAB — ROCKY MTN SPOTTED FVR ABS PNL(IGG+IGM)
RMSF IgG: UNDETERMINED
RMSF IgM: 0.39 index (ref 0.00–0.89)

## 2016-08-29 LAB — RMSF, IGG, IFA: RMSF, IGG, IFA: <1:64 {titer}

## 2016-08-30 ENCOUNTER — Telehealth: Payer: Self-pay | Admitting: Emergency Medicine

## 2016-08-30 NOTE — Telephone Encounter (Addendum)
Called patient to assure he was taking doxycycline as directed as his rmsf result was equivocal.  I left message asking him to call me..    I called medication management as he was given referral.  Mr. Demore did get the doxycycline filled there.  4/6--patient called me back and I explained about rmsf tests and he is taking the doxycycline.  He also had questions about obtaining a pcp.  I explained [facility] and [facility].  I also gave him A1C result.

## 2016-09-05 LAB — BLOOD GAS, VENOUS
ACID-BASE EXCESS: 0.7 mmol/L (ref 0.0–2.0)
Bicarbonate: 25.4 mmol/L (ref 20.0–28.0)
FIO2: 0.21
PH VEN: 7.41 (ref 7.250–7.430)
Patient temperature: 37
pCO2, Ven: 40 mmHg — ABNORMAL LOW (ref 44.0–60.0)

## 2016-10-31 ENCOUNTER — Ambulatory Visit: Payer: Self-pay

## 2016-11-17 ENCOUNTER — Ambulatory Visit: Payer: Self-pay | Admitting: Pharmacy Technician

## 2016-11-17 ENCOUNTER — Encounter (INDEPENDENT_AMBULATORY_CARE_PROVIDER_SITE_OTHER): Payer: Self-pay

## 2016-11-17 DIAGNOSIS — Z79899 Other long term (current) drug therapy: Secondary | ICD-10-CM

## 2016-11-21 ENCOUNTER — Telehealth: Payer: Self-pay | Admitting: Pharmacy Technician

## 2016-11-21 NOTE — Telephone Encounter (Signed)
Explained to patient that to receive medication assistance at [facility], you either must be an [place] [place] resident or being seen by an [facility] provider.  Patient stated that he is going to contact Dylan Buck to schedule an appointment.  Sherilyn DacostaBetty J. Kluttz Care Manager [facility]

## 2016-11-21 NOTE — Progress Notes (Signed)
Completed [facility] application and contract.  Patient agreed to all terms of the [facility] contract.  Patient approved to receive medication assistance through 2018 at [facility], as long as eligibility criteria continues to be met.  Provided patient with Civil engineer, contracting based on his particular needs.    [facility]

## 2017-02-15 ENCOUNTER — Telehealth: Payer: Self-pay | Admitting: Pharmacist

## 2017-02-15 NOTE — Telephone Encounter (Signed)
02/15/17 Faxed Novo Nordisk application for [employer] 6 units under the skin with each meal and as directed per sliding scale, # 5 vials. Max daily dose 36 units.AJ 02/15/17 Faxed [employer] application for [employer] pen Inject 36 units daily at bedtime # 4.[name]

## 2017-04-11 ENCOUNTER — Telehealth: Payer: Self-pay | Admitting: Pharmacist

## 2017-04-11 NOTE — Telephone Encounter (Signed)
--   Dylan Buck - Wednesday, April 11, 2017 1:38 PM -- Faxing [employer] refill request for Lantus Solostar Inject 36 units daily at bedtime, # 4. Just received back from provider.

## 2017-06-13 ENCOUNTER — Telehealth: Payer: Self-pay | Admitting: Pharmacist

## 2017-06-13 NOTE — Telephone Encounter (Signed)
06/13/17 Faxed Novo Nordisk refill request for [employer] Vial Max daily dose 36 units Inject 6 units under the skin with each meal and per sliding scale # 5.Forde RadonAJ

## 2017-06-26 ENCOUNTER — Telehealth: Payer: Self-pay | Admitting: Pharmacist

## 2017-06-26 NOTE — Telephone Encounter (Signed)
06/26/17 [employer] enrollment expired 06/06/17-patient needs to re enroll for [employer]antus Solostar.Forde RadonAJ

## 2017-06-26 NOTE — Telephone Encounter (Signed)
06/26/17 Patient [facility] patient picked up a 90 day supply on 04/30/17. I have printed Renewal [employer] application for Lantus Solostar Inject 36 units daily at bedtime, mailing provider Dr Letta PateAycock @ [facility] his portion to sign & return, also mailing patient his portion to sign & return. He has provided all updated financial information.Forde RadonAJ

## 2017-07-24 ENCOUNTER — Telehealth: Payer: Self-pay | Admitting: Pharmacist

## 2017-07-24 NOTE — Telephone Encounter (Signed)
07/24/2017 3:24:04 PM - Lantus Solostar  07/24/17 Faxed [employer] application for [employer]Lantus Solostar Inject 36 units daily at bedtime.Forde RadonAJ

## 2017-07-26 ENCOUNTER — Telehealth: Payer: Self-pay | Admitting: Pharmacist

## 2017-07-26 NOTE — Telephone Encounter (Signed)
07/26/2017 1:45:53 PM - Novolog vial refill  07/26/17 Mailing [employer] refill request for [employer] Supply[employer] Vials Inject 7 units under the skin with each meal and per sliding scale #5, Max daily dose 42 units to Dr. Karie FetchNgwe Aycock @ [facility] to sign & return.Forde RadonAJ

## 2017-08-06 ENCOUNTER — Telehealth: Payer: Self-pay | Admitting: Pharmacist

## 2017-08-06 NOTE — Telephone Encounter (Signed)
08/06/2017 4:05:51 PM - Novolog Vials refill/dose change  08/06/17 Faxed Novo Nordisk refill/dose change for [employer] Vials Inject 7 units under the skin with each meal and per sliding scale #5, Max daily dose 42 units.Forde RadonAJ

## 2017-08-08 ENCOUNTER — Telehealth: Payer: Self-pay | Admitting: Pharmacist

## 2017-08-08 NOTE — Telephone Encounter (Signed)
08/08/2017 2:15:52 PM - Lantus Solostar dose change  08/08/17 Mailing provider Dr. Karie FetchNgwe Aycock @ [facility] a [employer] refill request for Dose Change on Lantus Solostar Inject 30 units under the skin every night #2.Forde RadonAJ

## 2018-02-28 ENCOUNTER — Telehealth: Payer: Self-pay | Admitting: Pharmacist

## 2018-02-28 NOTE — Telephone Encounter (Signed)
02/28/2018 12:21:39 PM - Lantus Solostar refill  02/28/18 Mailing [employer] refill request to Dr. Karie Fetch @ [facility] to sign for refill on Lantus Solostar pen Inject 30 units under the skin every night #2.[name]

## 2018-11-08 ENCOUNTER — Other Ambulatory Visit: Payer: Self-pay

## 2018-11-08 ENCOUNTER — Encounter (HOSPITAL_COMMUNITY): Payer: Self-pay | Admitting: Emergency Medicine

## 2018-11-08 ENCOUNTER — Ambulatory Visit (HOSPITAL_COMMUNITY)
Admission: EM | Admit: 2018-11-08 | Discharge: 2018-11-08 | Disposition: A | Discharge status: Home / Self Care | Payer: Self-pay | Attending: Family Medicine | Admitting: Family Medicine

## 2018-11-08 DIAGNOSIS — E109 Type 1 diabetes mellitus without complications: Secondary | ICD-10-CM

## 2018-11-08 MED ORDER — NOVOLOG FLEXPEN 100 UNIT/ML ~~LOC~~ SOPN: 1.0000 [IU] | PEN_INJECTOR | Freq: Three times a day (TID) | SUBCUTANEOUS | 1 refills | Status: DC

## 2018-11-08 NOTE — ED Provider Notes (Signed)
[facility]    CSN: [id number] Arrival date & time: 11/08/18  1948      History   Chief Complaint Chief Complaint  Patient presents with  . Medication Refill    HPI Dylan Buck is a 43 y.o. male.   HPI  Patient is here for refill of diabetes medications/insulin NovoLog FlexPen.  He is having difficulty with communication with his provider because of COVID.  They have not gotten back to him that he does not have enough medicine to last until Monday.  He is here requesting a temporary refill of medication.  He is not having any medical problems.  He has not had any high or low sugars.  He does have a primary care provider.  Past Medical History:  Diagnosis Date  . Asthma   . Diabetes mellitus without complication [facility](HCC)     Patient Active Problem List   Diagnosis Date Noted  . Acute kidney injury (HCC) 08/25/2016  . AKI (acute kidney injury) (HCC) 08/25/2016    History reviewed. No pertinent surgical history.     Home Medications    Prior to Admission medications   Medication Sig Start Date End Date Taking? Authorizing Provider  insulin aspart (NOVOLOG FLEXPEN) 100 UNIT/ML FlexPen Inject 1-15 Units into the skin 3 (three) times daily with meals. Sliding scale 11/08/18   Eustace MooreNelson,  Sue, MD  insulin glargine (LANTUS) 100 UNIT/ML injection Inject 28 Units into the skin at bedtime.    [provider]    Family History History reviewed. No pertinent family history.  Social History Social History   Tobacco Use  . Smoking status: Never Smoker  . Smokeless tobacco: Never Used  Substance Use Topics  . Alcohol use: No  . Drug use: No     Allergies   Latex   Review of Systems Review of Systems  Constitutional: Negative for chills and fever.  HENT: Negative for ear pain and sore throat.   Eyes: Negative for pain and visual disturbance.  Respiratory: Negative for cough and shortness of breath.   Cardiovascular: Negative for chest pain  and palpitations.  Gastrointestinal: Negative for abdominal pain and vomiting.  Endocrine: Negative for polydipsia and polyuria.  Genitourinary: Negative for dysuria and hematuria.  Musculoskeletal: Negative for arthralgias and back pain.  Skin: Negative for color change and rash.  Neurological: Negative for seizures and syncope.  All other systems reviewed and are negative.    Physical Exam Triage Vital Signs ED Triage Vitals [11/08/18 1959]  Enc Vitals Group     BP (!) 178/94     Pulse Rate 85     Resp 18     Temp 98.2 F (36.8 C)     Temp Source Oral     SpO2 97 %     Weight      Height      Head Circumference      Peak Flow      Pain Score 0     Pain Loc      Pain Edu?      Excl. in GC?    No data found.  Updated Vital Signs BP (!) 178/94 (BP Location: Right Arm)   Pulse 85   Temp 98.2 F (36.8 C) (Oral)   Resp 18   SpO2 97%   Visual Acuity Right Eye Distance:   Left Eye Distance:   Bilateral Distance:    Right Eye Near:   Left Eye Near:    Bilateral Near:  Physical Exam Constitutional:      General: He is not in acute distress.    Appearance: He is well-developed.  HENT:     Head: Normocephalic and atraumatic.  Eyes:     Conjunctiva/sclera: Conjunctivae normal.     Pupils: Pupils are equal, round, and reactive to light.  Neck:     Musculoskeletal: Normal range of motion.  Cardiovascular:     Rate and Rhythm: Normal rate and regular rhythm.     Heart sounds: Normal heart sounds.  Pulmonary:     Effort: Pulmonary effort is normal. No respiratory distress.     Breath sounds: Normal breath sounds.  Abdominal:     General: There is no distension.     Palpations: Abdomen is soft.  Musculoskeletal: Normal range of motion.  Skin:    General: Skin is warm and dry.  Neurological:     Mental Status: He is alert.      UC Treatments / Results  Labs (all labs ordered are listed, but only abnormal results are displayed) Labs Reviewed - No  data to display  EKG None  Radiology No results found.  Procedures Procedures (including critical care time)  Medications Ordered in UC Medications - No data to display  Initial Impression / Assessment and Plan / UC Course  I have reviewed the triage vital signs and the nursing notes.  Pertinent labs & imaging results that were available during my care of the patient were reviewed by me and considered in my medical decision making (see chart for details).      Final Clinical Impressions(s) / UC Diagnoses   Final diagnoses:  Type 1 diabetes mellitus without complication [facility])     Discharge Instructions     Medicine sent to pharmacy   ED Prescriptions    Medication Sig Dispense Auth. Provider   insulin aspart (NOVOLOG FLEXPEN) 100 UNIT/ML FlexPen Inject 1-15 Units into the skin 3 (three) times daily with meals. Sliding scale 15 mL Raylene Everts, MD     Controlled Substance Prescriptions [place] Controlled Substance Registry consulted? Not Applicable   Raylene Everts, MD 11/08/18 2014

## 2018-11-08 NOTE — Discharge Instructions (Signed)
Medicine sent to pharmacy

## 2018-11-08 NOTE — ED Triage Notes (Signed)
Pt here for medication refill on insulin

## 2018-12-17 ENCOUNTER — Telehealth: Payer: Self-pay | Admitting: Pharmacy Technician

## 2018-12-17 NOTE — Telephone Encounter (Signed)
Patient failed to provide 2020 poi.  No additional medication assistance will be provided by [facility] without the required proof of income documentation.  Patient notified by letter.  Roshini Fulwider J. Calia Napp Care Manager [facility] 

## 2019-05-08 ENCOUNTER — Telehealth: Payer: Self-pay | Admitting: Pharmacy Technician

## 2019-05-08 NOTE — Telephone Encounter (Signed)
Patient failed to provide 2020 poi.  No additional medication assistance will be provided by [facility] without the required proof of income documentation.  Patient notified by letter.  Lovella Hardie J. Zaron Zwiefelhofer Care Manager [facility] 

## 2019-05-10 ENCOUNTER — Encounter: Payer: Self-pay | Admitting: Physician Assistant

## 2019-05-10 ENCOUNTER — Emergency Department
Admission: EM | Admit: 2019-05-10 | Discharge: 2019-05-10 | Disposition: A | Discharge status: Home / Self Care | Payer: Self-pay | Attending: Emergency Medicine | Admitting: Emergency Medicine

## 2019-05-10 ENCOUNTER — Encounter: Payer: Self-pay | Admitting: Emergency Medicine

## 2019-05-10 ENCOUNTER — Other Ambulatory Visit: Payer: Self-pay

## 2019-05-10 DIAGNOSIS — H60502 Unspecified acute noninfective otitis externa, left ear: Secondary | ICD-10-CM | POA: Insufficient documentation

## 2019-05-10 DIAGNOSIS — J45909 Unspecified asthma, uncomplicated: Secondary | ICD-10-CM | POA: Insufficient documentation

## 2019-05-10 DIAGNOSIS — E119 Type 2 diabetes mellitus without complications: Secondary | ICD-10-CM | POA: Insufficient documentation

## 2019-05-10 DIAGNOSIS — Z794 Long term (current) use of insulin: Secondary | ICD-10-CM | POA: Insufficient documentation

## 2019-05-10 DIAGNOSIS — H9202 Otalgia, left ear: Secondary | ICD-10-CM

## 2019-05-10 MED ORDER — TRAMADOL HCL 50 MG PO TABS: 50.0000 mg | ORAL_TABLET | Freq: Three times a day (TID) | ORAL | 0 refills | Status: DC | PRN

## 2019-05-10 MED ORDER — TRAMADOL HCL 50 MG PO TABS: 50.0000 mg | ORAL_TABLET | Freq: Three times a day (TID) | ORAL | 0 refills | Status: AC | PRN

## 2019-05-10 MED ORDER — NEOMYCIN-POLYMYXIN-HC 3.5-10000-1 OT SOLN: 4.0000 [drp] | Freq: Four times a day (QID) | OTIC | 0 refills | Status: AC

## 2019-05-10 MED ORDER — NEOMYCIN-POLYMYXIN-HC 3.5-10000-1 OT SOLN: 4.0000 [drp] | Freq: Four times a day (QID) | OTIC | 0 refills | Status: DC

## 2019-05-10 MED ORDER — FEXOFENADINE-PSEUDOEPHED ER 60-120 MG PO TB12: 1.0000 | ORAL_TABLET | Freq: Two times a day (BID) | ORAL | 0 refills | Status: DC

## 2019-05-10 NOTE — ED Triage Notes (Signed)
Pt c/o bilateral ear pain since last night, worse today, L worse than R.

## 2019-05-10 NOTE — Discharge Instructions (Addendum)
Your exam reveals a normal ear drum without signs of infection or rupture. You will be treated for an external ear canal infection/inflammation. Use the ear drops and allergy medicine as directed. Take the pain medicine as needed. Follow-up with Dr. Richardson Landry for continued symptoms.

## 2019-05-10 NOTE — ED Provider Notes (Signed)
[facility] Emergency Department Provider Note ____________________________________________  Time seen: 1944  I have reviewed the triage vital signs and the nursing notes.  HISTORY  Chief Complaint  Otalgia  HPI Dylan Buck is a 43 y.o. male presents himself to the ED for evaluation of bilateral ear pain and pressure.   Patient reports onset last night, notes her symptoms are worse today citing the left ear slightly worse than the right.  He denies any dizziness, vertigo, tinnitus, syncope, or hearing loss.  He also denies any otorrhea, or local trauma.  Patient denies any history of chronic or ongoing ear problems.  Past Medical History:  Diagnosis Date  . Asthma   . Diabetes mellitus without complication [facility])     Patient Active Problem List   Diagnosis Date Noted  . Acute kidney injury (HCC) 08/25/2016  . AKI (acute kidney injury) (HCC) 08/25/2016    History reviewed. No pertinent surgical history.  Prior to Admission medications   Medication Sig Start Date End Date Taking? Authorizing Provider  insulin aspart (NOVOLOG FLEXPEN) 100 UNIT/ML FlexPen Inject 1-15 Units into the skin 3 (three) times daily with meals. Sliding scale 11/08/18   Eustace Moore, MD  insulin glargine (LANTUS) 100 UNIT/ML injection Inject 28 Units into the skin at bedtime.    [provider]  neomycin-polymyxin-hydrocortisone (CORTISPORIN) OTIC solution Place 4 drops into the left ear 4 (four) times daily for 10 days. 05/10/19 05/20/19  Torry Istre, Charlesetta Ivory, PA-C  traMADol (ULTRAM) 50 MG tablet Take 1 tablet (50 mg total) by mouth 3 (three) times daily as needed for up to 3 days. 05/10/19 05/13/19  Gregorio Worley, Charlesetta Ivory, PA-C    Allergies Latex  History reviewed. No pertinent family history.  Social History Social History   Tobacco Use  . Smoking status: Never Smoker  . Smokeless tobacco: Never Used  Substance Use Topics  . Alcohol use: No  . Drug  use: No    Review of Systems  Constitutional: Negative for fever. Eyes: Negative for visual changes. ENT: Negative for sore throat. Reports bilateral otalgia.  Cardiovascular: Negative for chest pain. Respiratory: Negative for shortness of breath. Gastrointestinal: Negative for abdominal pain, vomiting and diarrhea. Genitourinary: Negative for dysuria. Musculoskeletal: Negative for back pain. Skin: Negative for rash. Neurological: Negative for headaches, focal weakness or numbness. ____________________________________________  PHYSICAL EXAM:  VITAL SIGNS: ED Triage Vitals  Enc Vitals Group     BP 05/10/19 1845 (!) 157/95     Pulse Rate 05/10/19 1845 94     Resp 05/10/19 1845 18     Temp 05/10/19 1845 99.9 F (37.7 C)     Temp Source 05/10/19 1845 Oral     SpO2 05/10/19 1845 100 %     Weight 05/10/19 1846 180 lb (81.6 kg)     Height 05/10/19 1846 5\' 10"  (1.778 m)     Head Circumference --      Peak Flow --      Pain Score 05/10/19 1845 10     Pain Loc --      Pain Edu? --      Excl. in GC? --     Constitutional: Alert and oriented. Well appearing and in no distress.  Patient is sitting in the chair with his head buried in his lap, and his hands over his ears upon entering the room. Head: Normocephalic and atraumatic. Eyes: Conjunctivae are normal. PERRL. Normal extraocular movements Ears: Canals clear. TMs intact bilaterally.  Left  TM without obvious deformity erythema or prominence.  Patient is without any mastoiditis or tenderness.  He is tender to the left ear with manipulation of the pinna and tragus.  The ear canal does not appear erythematous or edematous.  There is no laceration appreciated.  Pain is elicited with attempting to place the otoscope tip into the proximal ear canal.  TM is visualized and is intact without purulence, effusion, bulging, or rupture. Nose: No congestion/rhinorrhea/epistaxis. Hematological/Lymphatic/Immunological: No cervical or preauricular  lymphadenopathy. Cardiovascular: Normal rate, regular rhythm. Normal distal pulses. Respiratory: Normal respiratory effort. No wheezes/rales/rhonchi. Musculoskeletal: Nontender with normal range of motion in all extremities.  Neurologic:  Normal gait without ataxia. Normal speech and language. No gross focal neurologic deficits are appreciated. Skin:  Skin is warm, dry and intact. No rash noted. Psychiatric: Mood and affect are normal. Patient exhibits appropriate insight and judgment. ____________________________________________  PROCEDURES  Procedures ____________________________________________  INITIAL IMPRESSION / ASSESSMENT AND PLAN / ED COURSE  Patient with ED evaluation and management of acute left ear pain likely consistent with an otitis externa.  No indication of an acute otitis media, mastoiditis, sinusitis, or pharyngitis.  Patient will be treated with Polysporin otic solution as well as Ultram for pain.  He may take over-the-counter allergy medicines as needed.  He is referred to ENT for ongoing symptoms.  Tamon J Len was evaluated in Emergency Department on 05/10/2019 for the symptoms described in the history of present illness. He was evaluated in the context of the global COVID-19 pandemic, which necessitated consideration that the patient might be at risk for infection with the SARS-CoV-2 virus that causes COVID-19. Institutional protocols and algorithms that pertain to the evaluation of patients at risk for COVID-19 are in a state of rapid change based on information released by regulatory bodies including the CDC and federal and state organizations. These policies and algorithms were followed during the patient's care in the ED. ____________________________________________  FINAL CLINICAL IMPRESSION(S) / ED DIAGNOSES  Final diagnoses:  Otalgia of left ear  Acute noninfective otitis externa of left ear, unspecified type      Carmie End, Dannielle Karvonen, PA-C 05/10/19  2030    Vanessa Caballo, MD 05/12/19 1534

## 2021-05-05 ENCOUNTER — Emergency Department (HOSPITAL_COMMUNITY)
Admission: EM | Admit: 2021-05-05 | Discharge: 2021-05-05 | Disposition: A | Discharge status: Home / Self Care | Payer: Self-pay | Attending: Emergency Medicine | Admitting: Emergency Medicine

## 2021-05-05 ENCOUNTER — Emergency Department (HOSPITAL_COMMUNITY): Payer: Self-pay

## 2021-05-05 DIAGNOSIS — R531 Weakness: Secondary | ICD-10-CM | POA: Insufficient documentation

## 2021-05-05 DIAGNOSIS — Z794 Long term (current) use of insulin: Secondary | ICD-10-CM | POA: Insufficient documentation

## 2021-05-05 DIAGNOSIS — J45909 Unspecified asthma, uncomplicated: Secondary | ICD-10-CM | POA: Insufficient documentation

## 2021-05-05 DIAGNOSIS — Z91040 Latex allergy status: Secondary | ICD-10-CM | POA: Insufficient documentation

## 2021-05-05 DIAGNOSIS — E119 Type 2 diabetes mellitus without complications: Secondary | ICD-10-CM | POA: Insufficient documentation

## 2021-05-05 DIAGNOSIS — G510 Bell's palsy: Secondary | ICD-10-CM

## 2021-05-05 DIAGNOSIS — R200 Anesthesia of skin: Secondary | ICD-10-CM | POA: Insufficient documentation

## 2021-05-05 LAB — I-STAT CHEM 8, ED
BUN: 19 mg/dL (ref 6–20)
Calcium, Ion: 1.23 mmol/L (ref 1.15–1.40)
Chloride: 105 mmol/L (ref 98–111)
Creatinine, Ser: 0.9 mg/dL (ref 0.61–1.24)
Glucose, Bld: 87 mg/dL (ref 70–99)
HCT: 38 % — ABNORMAL LOW (ref 39.0–52.0)
Hemoglobin: 12.9 g/dL — ABNORMAL LOW (ref 13.0–17.0)
Potassium: 3.8 mmol/L (ref 3.5–5.1)
Sodium: 140 mmol/L (ref 135–145)
TCO2: 25 mmol/L (ref 22–32)

## 2021-05-05 LAB — COMPREHENSIVE METABOLIC PANEL
ALT: 15 U/L (ref 0–44)
AST: 17 U/L (ref 15–41)
Albumin: 3.7 g/dL (ref 3.5–5.0)
Alkaline Phosphatase: 104 U/L (ref 38–126)
Anion gap: 11 (ref 5–15)
BUN: 17 mg/dL (ref 6–20)
CO2: 25 mmol/L (ref 22–32)
Calcium: 9.6 mg/dL (ref 8.9–10.3)
Chloride: 104 mmol/L (ref 98–111)
Creatinine, Ser: 0.95 mg/dL (ref 0.61–1.24)
GFR, Estimated: >60 mL/min (ref >60)
Glucose, Bld: 90 mg/dL (ref 70–99)
Potassium: 3.9 mmol/L (ref 3.5–5.1)
Sodium: 140 mmol/L (ref 135–145)
Total Bilirubin: 0.5 mg/dL (ref 0.3–1.2)
Total Protein: 7.4 g/dL (ref 6.5–8.1)

## 2021-05-05 LAB — DIFFERENTIAL
Abs Immature Granulocytes: 0.03 10*3/uL (ref 0.00–0.07)
Basophils Absolute: 0.1 10*3/uL (ref 0.0–0.1)
Basophils Relative: 1 %
Eosinophils Absolute: 0.1 10*3/uL (ref 0.0–0.5)
Eosinophils Relative: 2 %
Immature Granulocytes: 0 %
Lymphocytes Relative: 15 %
Lymphs Abs: 1.4 10*3/uL (ref 0.7–4.0)
Monocytes Absolute: 0.5 10*3/uL (ref 0.1–1.0)
Monocytes Relative: 6 %
Neutro Abs: 6.8 10*3/uL (ref 1.7–7.7)
Neutrophils Relative %: 76 %

## 2021-05-05 LAB — CBC
HCT: 39.5 % (ref 39.0–52.0)
Hemoglobin: 12.6 g/dL — ABNORMAL LOW (ref 13.0–17.0)
MCH: 30.8 pg (ref 26.0–34.0)
MCHC: 31.9 g/dL (ref 30.0–36.0)
MCV: 96.6 fL (ref 80.0–100.0)
Platelets: 432 10*3/uL — ABNORMAL HIGH (ref 150–400)
RBC: 4.09 MIL/uL — ABNORMAL LOW (ref 4.22–5.81)
RDW: 12.4 % (ref 11.5–15.5)
WBC: 9 10*3/uL (ref 4.0–10.5)
nRBC: 0 % (ref 0.0–0.2)

## 2021-05-05 LAB — APTT: aPTT: 35 seconds (ref 24–36)

## 2021-05-05 LAB — PROTIME-INR
INR: 1 (ref 0.8–1.2)
Prothrombin Time: 12.9 seconds (ref 11.4–15.2)

## 2021-05-05 LAB — CBG MONITORING, ED: Glucose-Capillary: 92 mg/dL (ref 70–99)

## 2021-05-05 MED ORDER — ASPIRIN 325 MG PO TABS
325.0000 mg | ORAL_TABLET | Freq: Every day | ORAL | Status: DC
  Administered 2021-05-05: 325 mg via ORAL
  Filled 2021-05-05: qty 1

## 2021-05-05 MED ORDER — SODIUM CHLORIDE 0.9% FLUSH
3.0000 mL | Freq: Once | INTRAVENOUS | Status: AC
Start: 2021-05-05 — End: 2021-05-05
  Administered 2021-05-05: 3 mL via INTRAVENOUS

## 2021-05-05 MED ORDER — IOHEXOL 350 MG/ML SOLN
75.0000 mL | Freq: Once | INTRAVENOUS | Status: AC | PRN
  Administered 2021-05-05: 75 mL via INTRAVENOUS

## 2021-05-05 MED ORDER — PREDNISONE 20 MG PO TABS: 60.0000 mg | ORAL_TABLET | Freq: Every day | ORAL | 0 refills | Status: AC

## 2021-05-05 MED ORDER — VALACYCLOVIR HCL 1 G PO TABS: 1000.0000 mg | ORAL_TABLET | Freq: Three times a day (TID) | ORAL | 0 refills | Status: AC

## 2021-05-05 NOTE — ED Triage Notes (Signed)
Pt here for facial numbness on the right side. Pt stated that symptoms begin at 10 this morning. Other symptoms patient endorsed was right sided tongue numbness, arm numbness and generalized weakness.

## 2021-05-05 NOTE — ED Provider Notes (Signed)
[facility] EMERGENCY DEPARTMENT Provider Note   CSN: [id number] Arrival date & time: 05/05/21  1553     History Chief Complaint  Patient presents with   Numbness    Facial     Dylan Buck is a 45 y.o. male presenting for evaluation of facial asymmetry and right-sided weakness.  Patient states he first noticed numbness/abnormal movement of his face around 9:00 this morning.  He woke up at 7, felt normal until 9.  Since then, he has noticed progressive weakness of the right side of his face and his right upper and lower extremity.  He also reports numbness of these areas.  No vision changes.  His speech is slurred because his mouth is not doing well, but no difficulty finding or getting his words out.  No history of similar.  He has a history of diabetes, type I, most recent A1c was 8.5.  He has no other medical problems, takes medications daily.  Does not smoke cigarettes.  Patient states the past few days he has had a pain in his right temple that shoots down to his right neck.  Additional history obtained from chart review. H/o asthma and DM.   HPI     Past Medical History:  Diagnosis Date   Asthma    Diabetes mellitus without complication [facility])     Patient Active Problem List   Diagnosis Date Noted   Acute kidney injury (HCC) 08/25/2016   AKI (acute kidney injury) (HCC) 08/25/2016    No past surgical history on file.     No family history on file.  Social History   Tobacco Use   Smoking status: Never   Smokeless tobacco: Never  Substance Use Topics   Alcohol use: No   Drug use: No    Home Medications Prior to Admission medications   Medication Sig Start Date End Date Taking? Authorizing Provider  insulin aspart (NOVOLOG FLEXPEN) 100 UNIT/ML FlexPen Inject 1-15 Units into the skin 3 (three) times daily with meals. Sliding scale 11/08/18   Eustace Moore, MD  insulin glargine (LANTUS) 100 UNIT/ML injection Inject 28 Units into the  skin at bedtime.    [provider]    Allergies    Latex  Review of Systems   Review of Systems  Neurological:  Positive for facial asymmetry, speech difficulty, weakness, numbness and headaches.  All other systems reviewed and are negative.  Physical Exam Updated Vital Signs BP (!) 184/94 (BP Location: Left Arm)   Pulse 88   Temp 98.3 F (36.8 C)   Resp 16   SpO2 97%   Physical Exam Vitals and nursing note reviewed.  Constitutional:      General: He is not in acute distress.    Appearance: Normal appearance.     Comments: Appears nontoxic  HENT:     Head: Normocephalic and atraumatic.  Eyes:     Extraocular Movements: Extraocular movements intact.     Conjunctiva/sclera: Conjunctivae normal.     Pupils: Pupils are equal, round, and reactive to light.  Neck:     Vascular: No carotid bruit.  Cardiovascular:     Rate and Rhythm: Normal rate and regular rhythm.     Pulses: Normal pulses.  Pulmonary:     Effort: Pulmonary effort is normal. No respiratory distress.     Breath sounds: Normal breath sounds. No wheezing.     Comments: Speaking in full sentences.  Clear lung sounds in all fields. Abdominal:  General: There is no distension.     Palpations: Abdomen is soft. There is no mass.     Tenderness: There is no abdominal tenderness. There is no guarding or rebound.  Musculoskeletal:     Cervical back: Normal range of motion and neck supple.     Comments: Weakness of RUE and RLE  Skin:    General: Skin is warm and dry.     Capillary Refill: Capillary refill takes less than 2 seconds.  Neurological:     Mental Status: He is alert and oriented to person, place, and time.     GCS: GCS eye subscore is 4. GCS verbal subscore is 5. GCS motor subscore is 6.     Cranial Nerves: Facial asymmetry present.     Sensory: Sensory deficit present.     Motor: Weakness present. No pronator drift.  Psychiatric:        Mood and Affect: Mood and affect normal.         Speech: Speech normal.        Behavior: Behavior normal.    ED Results / Procedures / Treatments   Labs (all labs ordered are listed, but only abnormal results are displayed) Labs Reviewed  PROTIME-INR  APTT  CBC  DIFFERENTIAL  COMPREHENSIVE METABOLIC PANEL  CBG MONITORING, ED  I-STAT CHEM 8, ED    EKG None  Radiology No results found.  Procedures Procedures   Medications Ordered in ED Medications  sodium chloride flush (NS) 0.9 % injection 3 mL (has no administration in time range)    ED Course  I have reviewed the triage vital signs and the nursing notes.  Pertinent labs & imaging results that were available during my care of the patient were reviewed by me and considered in my medical decision making (see chart for details).    MDM Rules/Calculators/A&P                           Patient presented for evaluation of right-sided facial numbness and weakness, and right upper extremity and lower extremity weakness.  On exam, patient has right-sided neurologic deficits.  However facial deficits include the forehead, which is most consistent with Bell's as opposed to a stroke.  But considering extremity weakness as well, will obtain stroke work-up.  Will obtain labs to ensure no metabolic abnormalities.  As symptoms began at 9:00 this morning, patient is outside the window for tPA.  Does not meet LVO criteria, thus no code stroke was called.  However will consult with neurology due to continuing symptoms which patient reports are progressing.  Discussed with Dr. Derry Lory from neurology who recommends CTA of the head and neck.  If negative, recommends MRI.  CT of the head and neck negative for acute findings, no LVO.  Labs interpreted by me, overall reassuring.   Pt signed out to Roselyn Bering, MD for f/u on MRI  Final Clinical Impression(s) / ED Diagnoses Final diagnoses:  None    Rx / DC Orders ED Discharge Orders     None        Alveria Apley,  PA-C 05/05/21 1823    Linwood Dibbles, MD 05/05/21 2201

## 2021-05-05 NOTE — ED Provider Notes (Signed)
Emergency Medicine Provider Triage Evaluation Note  Dylan Buck , a 45 y.o. male  was evaluated in triage.  Pt complains of right-sided facial numbness and weakness started around 9 AM this morning.  Also having worsening right-sided leg numbness and weakness.  Having trouble speaking and swallowing.  Is diabetic.  No recent history of upper respiratory infection.  Review of Systems  Positive:  Negative: See above   Physical Exam  BP (!) 184/94 (BP Location: Left Arm)   Pulse 88   Temp 98.3 F (36.8 C)   Resp 16   SpO2 97%  Gen:   Awake, no distress   Resp:  Normal effort  MSK:   Moves extremities without difficulty  Other:  Obvious cranial nerve VII palsy on the right.  Patient unable to close his right eye.  Peripheral fields intact.  3/5 strength in the right leg with subjective decrease sensation.  Normal sensation on the left upper and left lower extremity.  Normal strength and sensation in the right upper extremity.  Medical Decision Making  Medically screening exam initiated at 4:08 PM.  Appropriate orders placed.  Dylan Buck was informed that the remainder of the evaluation will be completed by another provider, this initial triage assessment does not replace that evaluation, and the importance of remaining in the ED until their evaluation is complete.  VAN negative.  Stroke work-up initiated.   Honor Loh [place], PA-C 05/05/21 1611    Linwood Dibbles, MD 05/06/21 [phone]

## 2021-05-05 NOTE — ED Provider Notes (Signed)
MRI does not show any signs of acute stroke.  Patient's presentation is consistent with a Bell's palsy.  Will discharge home on steroids and antivirals.  Discussed lubricating eyedrops and patching his eyelid shut during the night.   Linwood Dibbles, MD 05/05/21 2201

## 2021-05-05 NOTE — Discharge Instructions (Addendum)
Take the medications as prescribed.  Apply lubricating/artificial tears eyedrops to your affected eye during the day.  Patch your eye shut at nighttime to avoid any abrasions.  Follow-up with neurologist if the symptoms are not improving.  Please read the discharge instructions for additional information

## 2022-02-28 DIAGNOSIS — R69 Illness, unspecified: Secondary | ICD-10-CM | POA: Diagnosis not present

## 2022-02-28 DIAGNOSIS — J45909 Unspecified asthma, uncomplicated: Secondary | ICD-10-CM | POA: Diagnosis not present

## 2022-02-28 DIAGNOSIS — Z794 Long term (current) use of insulin: Secondary | ICD-10-CM | POA: Diagnosis not present

## 2022-02-28 DIAGNOSIS — E1069 Type 1 diabetes mellitus with other specified complication: Secondary | ICD-10-CM | POA: Diagnosis not present

## 2022-02-28 DIAGNOSIS — I10 Essential (primary) hypertension: Secondary | ICD-10-CM | POA: Diagnosis not present

## 2022-02-28 DIAGNOSIS — E1169 Type 2 diabetes mellitus with other specified complication: Secondary | ICD-10-CM | POA: Diagnosis not present

## 2023-04-30 IMAGING — CT CT ANGIO HEAD-NECK (W OR W/O PERF)
1 of 11 series · 5 of 33 positions shown · IV contrast (omnipaque)
Comparison: None.

CLINICAL DATA: Right facial numbness

EXAM:
CT ANGIOGRAPHY HEAD AND NECK
TECHNIQUE: Multidetector CT imaging of the head and neck was performed using
the standard protocol during bolus administration of intravenous
contrast. Multiplanar CT image reconstructions and MIPs were
obtained to evaluate the vascular anatomy. Carotid stenosis
measurements (when applicable) are obtained utilizing NASCET
criteria, using the distal internal carotid diameter as the
denominator.
CONTRAST:  75mL OMNIPAQUE IOHEXOL 350 MG/ML SOLN

[Series 11: cta neck axial · axial · 0.39mm/px · z∈[-271,-40]mm · 5 of 347 slices shown]
[im 58/347  soft-tissue]
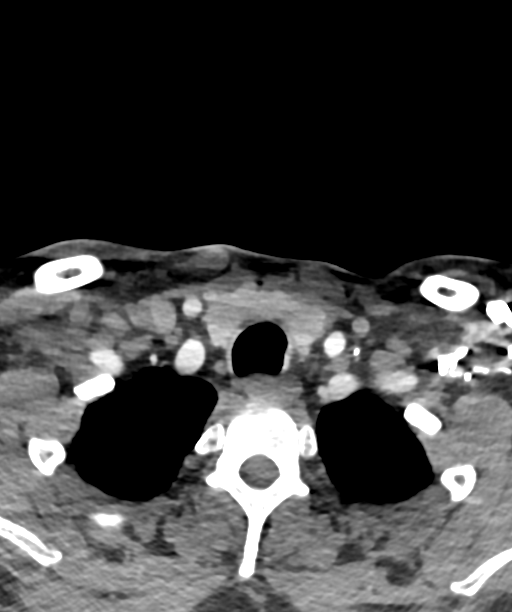
[im 116/347  bone]
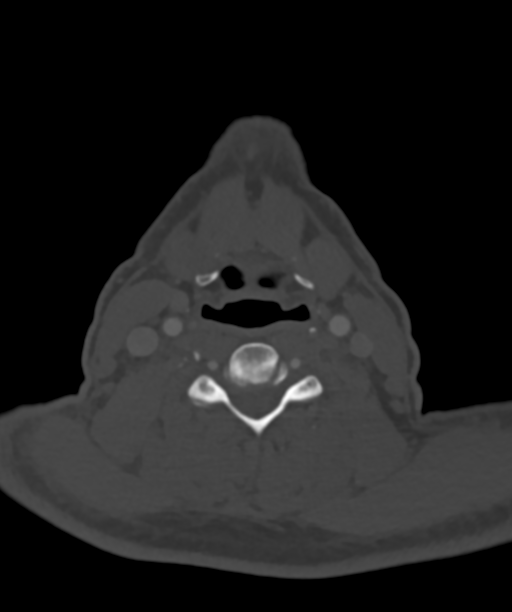
[im 174/347  soft-tissue]
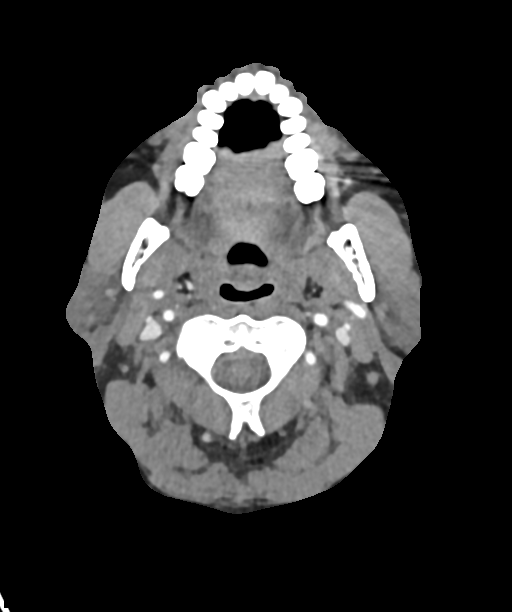
[im 231/347  bone]
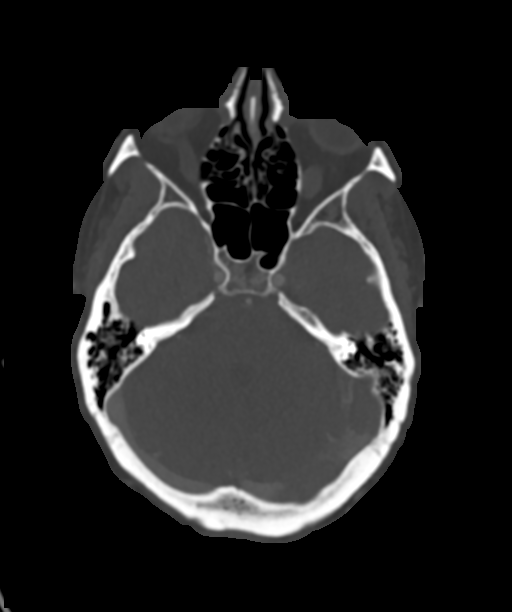
[im 289/347  soft-tissue]
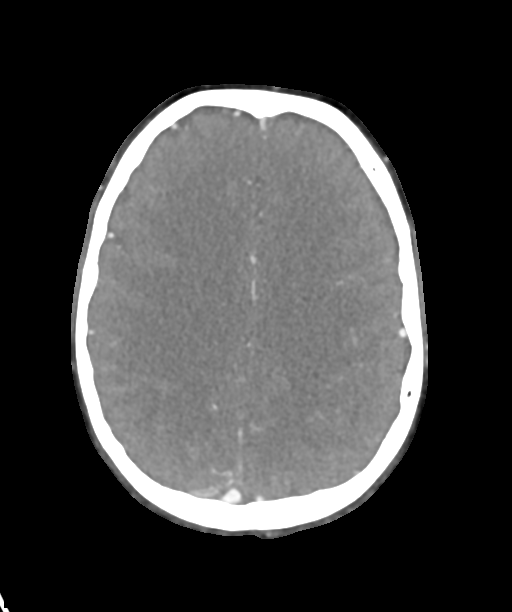

[5 of 33 positions shown; findings below may reference images not displayed]

FINDINGS: CT HEAD FINDINGS

Brain: No evidence of acute infarction, hemorrhage, cerebral edema,
mass, mass effect, or midline shift. No hydrocephalus or extra-axial
fluid collection.

Vascular: No hyperdense vessel.

Skull: Normal. Negative for fracture or focal lesion.

Sinuses/Orbits: No acute finding.

Other: The mastoid air cells are well aerated.

CTA NECK FINDINGS

Aortic arch: Standard branching. Imaged portion shows no evidence of
aneurysm or dissection. No significant stenosis of the major arch
vessel origins.

Right carotid system: No evidence of dissection, stenosis (50% or
greater) or occlusion.

Left carotid system: No evidence of dissection, stenosis (50% or
greater) or occlusion.

Vertebral arteries: Codominant. No evidence of dissection, stenosis
(50% or greater) or occlusion.

Skeleton: No acute osseous abnormality.

Other neck: Negative.

Upper chest: Negative.

Review of the MIP images confirms the above findings

CTA HEAD FINDINGS

Anterior circulation: Both internal carotid arteries are patent to
the termini, without stenosis or other abnormality. A1 segments
patent, although the right A1 is hypoplastic. Normal anterior
communicating artery. Anterior cerebral arteries are patent to their
distal aspects. No M1 stenosis or occlusion. Normal MCA
bifurcations. Distal MCA branches perfused and symmetric.

Posterior circulation: Vertebral arteries patent to the
vertebrobasilar junction without stenosis. Posterior inferior
cerebral arteries patent bilaterally. Basilar patent to its distal
aspect. Superior cerebellar arteries patent bilaterally. PCAs
perfused to their distal aspects without stenosis. The bilateral
posterior communicating arteries are not visualized.

Venous sinuses: As permitted by contrast timing, patent.

Anatomic variants: None significant

Review of the MIP images confirms the above findings
IMPRESSION: 1. No acute intracranial process.
2. No intracranial large vessel occlusion or significant stenosis.
3. No hemodynamically significant stenosis in the neck.

## 2023-07-13 ENCOUNTER — Other Ambulatory Visit: Payer: Self-pay

## 2023-07-13 ENCOUNTER — Other Ambulatory Visit (HOSPITAL_COMMUNITY): Payer: Self-pay

## 2023-07-13 MED ORDER — INSULIN GLARGINE 100 UNIT/ML ~~LOC~~ SOLN
20.0000 [IU] | Freq: Every day | SUBCUTANEOUS | 3 refills | Status: AC
  Filled 2023-07-13: qty 10, 28d supply, fill #0
  Filled 2023-08-13: qty 10, 50d supply, fill #0

## 2023-07-13 MED ORDER — INSULIN ASPART 100 UNIT/ML IJ SOLN
7.0000 [IU] | Freq: Three times a day (TID) | INTRAMUSCULAR | 3 refills | Status: DC
Start: 2023-07-13 — End: 2024-02-01
  Filled 2023-07-13 – 2023-09-10 (×2): qty 10, 28d supply, fill #0
  Filled 2023-10-05: qty 10, 28d supply, fill #1

## 2023-08-13 ENCOUNTER — Other Ambulatory Visit (HOSPITAL_COMMUNITY): Payer: Self-pay

## 2023-08-13 ENCOUNTER — Other Ambulatory Visit: Payer: Self-pay

## 2023-08-13 MED ORDER — TRUEPLUS 5-BEVEL PEN NEEDLES 31G X 6 MM MISC
3 refills | Status: AC
  Filled 2023-08-13: qty 100, 90d supply, fill #0

## 2023-08-13 MED ORDER — BASAGLAR KWIKPEN 100 UNIT/ML ~~LOC~~ SOPN
PEN_INJECTOR | SUBCUTANEOUS | 3 refills | Status: AC
  Filled 2023-08-13: qty 6, 30d supply, fill #0
  Filled 2023-09-10: qty 6, 30d supply, fill #1
  Filled 2023-10-05: qty 6, 30d supply, fill #2
  Filled 2024-03-04: qty 6, 30d supply, fill #3

## 2023-09-10 ENCOUNTER — Other Ambulatory Visit: Payer: Self-pay

## 2023-10-05 ENCOUNTER — Other Ambulatory Visit: Payer: Self-pay

## 2023-10-12 ENCOUNTER — Other Ambulatory Visit (HOSPITAL_COMMUNITY): Payer: Self-pay

## 2023-10-12 ENCOUNTER — Other Ambulatory Visit: Payer: Self-pay

## 2023-10-12 MED ORDER — BASAGLAR KWIKPEN 100 UNIT/ML ~~LOC~~ SOPN
30.0000 [IU] | PEN_INJECTOR | Freq: Every day | SUBCUTANEOUS | 3 refills | Status: DC
  Filled 2023-11-05: qty 9, 30d supply, fill #0
  Filled 2023-11-28: qty 9, 30d supply, fill #1
  Filled 2024-01-04: qty 9, 30d supply, fill #2
  Filled 2024-02-01: qty 9, 30d supply, fill #3
  Filled 2024-03-03 – 2024-04-07 (×3): qty 9, 30d supply, fill #4
  Filled 2024-05-01 (×2): qty 9, 30d supply, fill #5
  Filled 2024-06-03: qty 6, 20d supply, fill #6

## 2023-10-12 MED ORDER — LISINOPRIL 20 MG PO TABS
20.0000 mg | ORAL_TABLET | Freq: Every day | ORAL | 3 refills | Status: AC
  Filled 2023-10-12: qty 30, 30d supply, fill #0

## 2023-10-12 MED ORDER — INSULIN ASPART 100 UNIT/ML IJ SOLN
7.0000 [IU] | Freq: Three times a day (TID) | INTRAMUSCULAR | 3 refills | Status: DC
  Filled 2023-11-05: qty 10, 28d supply, fill #0

## 2023-10-24 ENCOUNTER — Other Ambulatory Visit (HOSPITAL_COMMUNITY): Payer: Self-pay

## 2023-11-03 ENCOUNTER — Other Ambulatory Visit (HOSPITAL_COMMUNITY): Payer: Self-pay

## 2023-11-05 ENCOUNTER — Other Ambulatory Visit: Payer: Self-pay

## 2023-11-06 ENCOUNTER — Other Ambulatory Visit: Payer: Self-pay

## 2023-11-08 ENCOUNTER — Other Ambulatory Visit (HOSPITAL_COMMUNITY): Payer: Self-pay

## 2023-11-08 MED ORDER — INSULIN ASPART 100 UNIT/ML IJ SOLN
7.0000 [IU] | Freq: Three times a day (TID) | INTRAMUSCULAR | 5 refills | Status: DC
  Filled 2023-11-28: qty 10, 48d supply, fill #0

## 2023-11-28 ENCOUNTER — Other Ambulatory Visit: Payer: Self-pay

## 2023-11-29 ENCOUNTER — Other Ambulatory Visit: Payer: Self-pay

## 2023-11-29 MED ORDER — INSULIN LISPRO 100 UNIT/ML IJ SOLN
7.0000 [IU] | Freq: Three times a day (TID) | INTRAMUSCULAR | 5 refills | Status: AC
  Filled 2023-11-29: qty 10, 28d supply, fill #0
  Filled 2024-01-04: qty 10, 28d supply, fill #1
  Filled 2024-02-01: qty 10, 28d supply, fill #2
  Filled 2024-03-03: qty 10, 28d supply, fill #3
  Filled 2024-04-07: qty 10, 28d supply, fill #4
  Filled 2024-04-29: qty 10, 28d supply, fill #5

## 2024-01-04 ENCOUNTER — Other Ambulatory Visit: Payer: Self-pay

## 2024-02-01 ENCOUNTER — Other Ambulatory Visit: Payer: Self-pay

## 2024-03-03 ENCOUNTER — Other Ambulatory Visit: Payer: Self-pay

## 2024-03-04 ENCOUNTER — Other Ambulatory Visit: Payer: Self-pay

## 2024-04-07 ENCOUNTER — Other Ambulatory Visit: Payer: Self-pay

## 2024-04-08 ENCOUNTER — Other Ambulatory Visit: Payer: Self-pay

## 2024-04-29 ENCOUNTER — Other Ambulatory Visit: Payer: Self-pay

## 2024-05-01 ENCOUNTER — Other Ambulatory Visit: Payer: Self-pay

## 2024-05-06 ENCOUNTER — Other Ambulatory Visit: Payer: Self-pay

## 2024-05-08 ENCOUNTER — Other Ambulatory Visit: Payer: Self-pay

## 2024-05-12 ENCOUNTER — Other Ambulatory Visit: Payer: Self-pay

## 2024-05-27 ENCOUNTER — Other Ambulatory Visit: Payer: Self-pay

## 2024-06-02 ENCOUNTER — Other Ambulatory Visit: Payer: Self-pay

## 2024-06-03 ENCOUNTER — Other Ambulatory Visit: Payer: Self-pay

## 2024-06-06 ENCOUNTER — Other Ambulatory Visit: Payer: Self-pay

## 2024-06-11 ENCOUNTER — Other Ambulatory Visit: Payer: Self-pay

## 2024-06-16 ENCOUNTER — Other Ambulatory Visit: Payer: Self-pay

## 2024-06-20 ENCOUNTER — Other Ambulatory Visit: Payer: Self-pay

## 2024-06-23 ENCOUNTER — Other Ambulatory Visit: Payer: Self-pay

## 2024-06-23 MED ORDER — BASAGLAR KWIKPEN 100 UNIT/ML ~~LOC~~ SOPN
30.0000 [IU] | PEN_INJECTOR | Freq: Every day | SUBCUTANEOUS | 3 refills | Status: AC
  Filled 2024-06-23: qty 9, 30d supply, fill #0

## 2024-06-27 ENCOUNTER — Other Ambulatory Visit: Payer: Self-pay
